# Patient Record
Sex: Male | Born: 1970 | Race: White | Hispanic: No | Marital: Married | Smoking: Never smoker
Health system: Southern US, Community
[De-identification: ages and names within clinical notes are randomized; demographics above are authoritative.]

## PROBLEM LIST (undated history)

## (undated) DIAGNOSIS — K5792 Diverticulitis of intestine, part unspecified, without perforation or abscess without bleeding: Secondary | ICD-10-CM

## (undated) DIAGNOSIS — K219 Gastro-esophageal reflux disease without esophagitis: Secondary | ICD-10-CM

## (undated) DIAGNOSIS — E079 Disorder of thyroid, unspecified: Secondary | ICD-10-CM

## (undated) DIAGNOSIS — E039 Hypothyroidism, unspecified: Secondary | ICD-10-CM

## (undated) HISTORY — PX: WISDOM TOOTH EXTRACTION: SHX21

## (undated) HISTORY — PX: OTHER SURGICAL HISTORY: SHX169

---

## 2008-02-05 ENCOUNTER — Emergency Department (HOSPITAL_COMMUNITY): Admission: EM | Admit: 2008-02-05 | Discharge: 2008-02-05 | Payer: Self-pay | Admitting: Emergency Medicine

## 2008-12-04 ENCOUNTER — Encounter: Admission: RE | Admit: 2008-12-04 | Discharge: 2008-12-04 | Payer: Self-pay | Admitting: Family Medicine

## 2011-08-01 LAB — CBC
HCT: 43
Hemoglobin: 15.2
MCV: 86.2
Platelets: 290
RBC: 4.98
WBC: 7.9

## 2011-08-01 LAB — URINALYSIS, ROUTINE W REFLEX MICROSCOPIC
Glucose, UA: NEGATIVE
Nitrite: NEGATIVE
Specific Gravity, Urine: 1.012
pH: 6.5

## 2011-08-01 LAB — DIFFERENTIAL
Eosinophils Absolute: 0.1
Eosinophils Relative: 1
Lymphocytes Relative: 28
Lymphs Abs: 2.2
Monocytes Relative: 7

## 2011-08-01 LAB — BASIC METABOLIC PANEL
BUN: 11
Chloride: 105
GFR calc Af Amer: >60
GFR calc non Af Amer: >60
Potassium: 4.4
Sodium: 138

## 2014-08-13 ENCOUNTER — Emergency Department (HOSPITAL_BASED_OUTPATIENT_CLINIC_OR_DEPARTMENT_OTHER)
Admission: EM | Admit: 2014-08-13 | Discharge: 2014-08-14 | Disposition: A | Discharge status: Home / Self Care | Payer: 59 | Attending: Emergency Medicine | Admitting: Emergency Medicine

## 2014-08-13 ENCOUNTER — Encounter (HOSPITAL_BASED_OUTPATIENT_CLINIC_OR_DEPARTMENT_OTHER): Payer: Self-pay | Admitting: Emergency Medicine

## 2014-08-13 DIAGNOSIS — J029 Acute pharyngitis, unspecified: Secondary | ICD-10-CM | POA: Diagnosis present

## 2014-08-13 DIAGNOSIS — R110 Nausea: Secondary | ICD-10-CM | POA: Insufficient documentation

## 2014-08-13 DIAGNOSIS — H9209 Otalgia, unspecified ear: Secondary | ICD-10-CM | POA: Insufficient documentation

## 2014-08-13 DIAGNOSIS — B349 Viral infection, unspecified: Secondary | ICD-10-CM

## 2014-08-13 DIAGNOSIS — E079 Disorder of thyroid, unspecified: Secondary | ICD-10-CM | POA: Insufficient documentation

## 2014-08-13 HISTORY — DX: Disorder of thyroid, unspecified: E07.9

## 2014-08-13 LAB — RAPID STREP SCREEN (MED CTR MEBANE ONLY): Streptococcus, Group A Screen (Direct): NEGATIVE

## 2014-08-13 MED ORDER — NAPROXEN 250 MG PO TABS
375.0000 mg | ORAL_TABLET | Freq: Once | ORAL | Status: DC
  Filled 2014-08-13: qty 2

## 2014-08-13 MED ORDER — FLUTICASONE PROPIONATE 50 MCG/ACT NA SUSP: 2.0000 | Freq: Every day | NASAL | Status: AC

## 2014-08-13 MED ORDER — NAPROXEN 375 MG PO TABS: 375.0000 mg | ORAL_TABLET | Freq: Two times a day (BID) | ORAL | Status: DC

## 2014-08-13 MED ORDER — CETIRIZINE HCL 10 MG PO CAPS: 1.0000 | ORAL_CAPSULE | ORAL | Status: DC

## 2014-08-13 NOTE — ED Provider Notes (Signed)
CSN: [id number]     Arrival date & time 08/13/14  2111 History  This chart was scribed for Minie Roadcap Smitty Cords, MD by Jarvis Morgan, ED Scribe. This patient was seen in room MH06/MH06 and the patient's care was started at 11:45 PM.    Chief Complaint  Patient presents with  . Sore Throat      Patient is a 43 y.o. male presenting with pharyngitis. The history is provided by the patient. No language interpreter was used.  Sore Throat This is a new problem. The current episode started 3 to 5 hours ago. The problem occurs constantly. The problem has not changed since onset.Pertinent negatives include no chest pain, no abdominal pain, no headaches and no shortness of breath. Nothing aggravates the symptoms. Nothing relieves the symptoms. He has tried nothing for the symptoms. The treatment provided no relief.   HPI Comments: Dylan Mccall is a 43 y.o. male who presents to the Emergency Department complaining of a strep throat that began earlier today. He is having associated pressure in his ears, nausea, watery eyes. He has not taken anything for the symptoms. Pt is worried he has step. He has a h/o of frequent strep infections. He denies any vomiting, fever, chills, diarrhea, or abdominal pain.   Past Medical History  Diagnosis Date  . Thyroid disease    History reviewed. No pertinent past surgical history. No family history on file. History  Substance Use Topics  . Smoking status: Never Smoker   . Smokeless tobacco: Not on file  . Alcohol Use: No    Review of Systems  Constitutional: Negative for fever and chills.  HENT: Positive for congestion, ear pain and sore throat. Negative for trouble swallowing and voice change.   Respiratory: Negative for shortness of breath.   Cardiovascular: Negative for chest pain.  Gastrointestinal: Positive for nausea. Negative for abdominal pain and diarrhea.  Neurological: Negative for headaches.  All other systems reviewed and are  negative.     Allergies  Review of patient's allergies indicates no known allergies.  Home Medications   Prior to Admission medications   Medication Sig Start Date End Date Taking? Authorizing Provider  Levothyroxine Sodium (SYNTHROID PO) Take by mouth.   Yes Historical Provider, MD   BP 131/99  Pulse 74  Temp(Src) 98.2 F (36.8 C) (Oral)  Resp 20  Ht 5\' 10"  (1.778 m)  Wt 218 lb (98.884 kg)  BMI 31.28 kg/m2  SpO2 98% Physical Exam  Nursing note and vitals reviewed. Constitutional: He is oriented to person, place, and time. He appears well-developed and well-nourished. No distress.  HENT:  Head: Normocephalic and atraumatic.  Right Ear: Tympanic membrane normal.  Left Ear: Tympanic membrane normal.  Mouth/Throat: Uvula is midline and oropharynx is clear and moist. No oral lesions. No oropharyngeal exudate, posterior oropharyngeal edema or posterior oropharyngeal erythema.  Eyes: Conjunctivae and EOM are normal. Pupils are equal, round, and reactive to light.  Neck: Trachea normal and normal range of motion. Neck supple. No tracheal deviation present.  Easily manipulated trachea, no pain with displacement of the trachea  Cardiovascular: Normal rate.   Pulmonary/Chest: Effort normal. No respiratory distress. He has no wheezes. He has no rales.  Abdominal: Soft. Bowel sounds are normal. There is no tenderness. There is no rebound and no guarding.  Musculoskeletal: Normal range of motion.  Lymphadenopathy:  No lymph nodes  Neurological: He is alert and oriented to person, place, and time.  Skin: Skin is warm and dry.  Psychiatric:  He has a normal mood and affect. His behavior is normal.    ED Course  Procedures (including critical care time) Labs Review Labs Reviewed  RAPID STREP SCREEN  CULTURE, GROUP A STREP    Imaging Review No results found.   EKG Interpretation None      MDM   Final diagnoses:  None   Based on centor criteria no need for further  intervention Viral syndrome.  Will treat with NSAIDS, flonase and zyrtec.  Follow up with your family doctor    Davie Sagona K Fifi Schindler-Rasch, MD 08/13/14 2357

## 2014-08-13 NOTE — ED Notes (Signed)
Sore throat, pressure in his ears and nausea. He feels like he has strep.

## 2014-08-13 NOTE — ED Notes (Signed)
sorethroat onset 1400 this pm,  Eyes and ears hurtingg, denies cough or congestion

## 2014-08-14 MED ORDER — NAPROXEN 250 MG PO TABS
500.0000 mg | ORAL_TABLET | Freq: Once | ORAL | Status: AC
Start: 2014-08-14 — End: 2014-08-14
  Administered 2014-08-14: 500 mg via ORAL

## 2014-08-15 LAB — CULTURE, GROUP A STREP

## 2015-02-03 ENCOUNTER — Other Ambulatory Visit: Payer: Self-pay | Admitting: Family Medicine

## 2015-02-04 ENCOUNTER — Other Ambulatory Visit: Payer: Self-pay | Admitting: Family Medicine

## 2015-02-04 DIAGNOSIS — M7989 Other specified soft tissue disorders: Secondary | ICD-10-CM

## 2015-02-08 ENCOUNTER — Ambulatory Visit
Admission: RE | Admit: 2015-02-08 | Discharge: 2015-02-08 | Disposition: A | Discharge status: Home / Self Care | Payer: 59 | Source: Ambulatory Visit | Attending: Family Medicine | Admitting: Family Medicine

## 2015-02-08 DIAGNOSIS — M7989 Other specified soft tissue disorders: Secondary | ICD-10-CM

## 2015-02-13 ENCOUNTER — Encounter (HOSPITAL_COMMUNITY): Payer: Self-pay | Admitting: Emergency Medicine

## 2015-02-13 ENCOUNTER — Emergency Department (HOSPITAL_COMMUNITY)
Admission: EM | Admit: 2015-02-13 | Discharge: 2015-02-13 | Disposition: A | Discharge status: Home / Self Care | Payer: 59 | Attending: Emergency Medicine | Admitting: Emergency Medicine

## 2015-02-13 DIAGNOSIS — L550 Sunburn of first degree: Secondary | ICD-10-CM | POA: Diagnosis not present

## 2015-02-13 DIAGNOSIS — E079 Disorder of thyroid, unspecified: Secondary | ICD-10-CM | POA: Diagnosis not present

## 2015-02-13 DIAGNOSIS — R110 Nausea: Secondary | ICD-10-CM | POA: Insufficient documentation

## 2015-02-13 DIAGNOSIS — M79601 Pain in right arm: Secondary | ICD-10-CM | POA: Insufficient documentation

## 2015-02-13 DIAGNOSIS — R51 Headache: Secondary | ICD-10-CM | POA: Insufficient documentation

## 2015-02-13 DIAGNOSIS — Z7951 Long term (current) use of inhaled steroids: Secondary | ICD-10-CM | POA: Insufficient documentation

## 2015-02-13 DIAGNOSIS — R42 Dizziness and giddiness: Secondary | ICD-10-CM | POA: Insufficient documentation

## 2015-02-13 DIAGNOSIS — M542 Cervicalgia: Secondary | ICD-10-CM

## 2015-02-13 DIAGNOSIS — R0789 Other chest pain: Secondary | ICD-10-CM | POA: Diagnosis not present

## 2015-02-13 DIAGNOSIS — Z79899 Other long term (current) drug therapy: Secondary | ICD-10-CM | POA: Diagnosis not present

## 2015-02-13 DIAGNOSIS — R5383 Other fatigue: Secondary | ICD-10-CM | POA: Insufficient documentation

## 2015-02-13 LAB — CBC
HCT: 44.5 % (ref 39.0–52.0)
Hemoglobin: 14.9 g/dL (ref 13.0–17.0)
MCH: 29.7 pg (ref 26.0–34.0)
MCHC: 33.5 g/dL (ref 30.0–36.0)
MCV: 88.8 fL (ref 78.0–100.0)
Platelets: 300 10*3/uL (ref 150–400)
RBC: 5.01 MIL/uL (ref 4.22–5.81)
RDW: 13.4 % (ref 11.5–15.5)
WBC: 10.2 10*3/uL (ref 4.0–10.5)

## 2015-02-13 LAB — HEPATIC FUNCTION PANEL
ALBUMIN: 4.8 g/dL (ref 3.5–5.2)
ALK PHOS: 78 U/L (ref 39–117)
ALT: 34 U/L (ref 0–53)
AST: 31 U/L (ref 0–37)
BILIRUBIN DIRECT: 0.2 mg/dL (ref 0.0–0.5)
BILIRUBIN INDIRECT: 0.8 mg/dL (ref 0.3–0.9)
BILIRUBIN TOTAL: 1 mg/dL (ref 0.3–1.2)
TOTAL PROTEIN: 8.2 g/dL (ref 6.0–8.3)

## 2015-02-13 LAB — D-DIMER, QUANTITATIVE: D-Dimer, Quant: <0.27 ug{FEU}/mL (ref 0.00–0.48)

## 2015-02-13 LAB — I-STAT TROPONIN, ED
TROPONIN I, POC: 0 ng/mL (ref 0.00–0.08)
Troponin i, poc: 0 ng/mL (ref 0.00–0.08)

## 2015-02-13 LAB — BASIC METABOLIC PANEL
Anion gap: 10 (ref 5–15)
BUN: 20 mg/dL (ref 6–23)
CHLORIDE: 105 mmol/L (ref 96–112)
CO2: 24 mmol/L (ref 19–32)
CREATININE: 0.95 mg/dL (ref 0.50–1.35)
Calcium: 8.9 mg/dL (ref 8.4–10.5)
GFR calc Af Amer: >90 mL/min (ref >90)
GLUCOSE: 88 mg/dL (ref 70–99)
POTASSIUM: 4.1 mmol/L (ref 3.5–5.1)
Sodium: 139 mmol/L (ref 135–145)

## 2015-02-13 LAB — LIPASE, BLOOD: Lipase: 28 U/L (ref 11–59)

## 2015-02-13 MED ORDER — ONDANSETRON HCL 4 MG/2ML IJ SOLN
4.0000 mg | Freq: Once | INTRAMUSCULAR | Status: AC
  Administered 2015-02-13: 4 mg via INTRAVENOUS
  Filled 2015-02-13: qty 2

## 2015-02-13 MED ORDER — SODIUM CHLORIDE 0.9 % IV BOLUS (SEPSIS)
1000.0000 mL | Freq: Once | INTRAVENOUS | Status: AC
  Administered 2015-02-13: 1000 mL via INTRAVENOUS

## 2015-02-13 NOTE — ED Notes (Signed)
Delay on lab draw nurse will start IV.

## 2015-02-13 NOTE — ED Notes (Signed)
Pt c/o intermittent dizziness, nausea, weakness; along with right sided arm pain, right sided facial numbness, burning, and heat, dizziness, nausea. Pt ambulating when he suddenly felt weak and needed to sit down.

## 2015-02-13 NOTE — ED Provider Notes (Signed)
CSN: [id number]     Arrival date & time 02/13/15  1647 History   First MD Initiated Contact with Patient 02/13/15 1732     Chief Complaint  Patient presents with  . Dizziness  . Nausea  . Arm Pain     (Consider location/radiation/quality/duration/timing/severity/associated sxs/prior Treatment) HPI Comments: Patient presents to emergency department with multiple complaints. States that today while working in the yard he had sudden onset dizziness, nausea, and fatigue. Reports associated right-sided neck and arm pain. Reports associated right-sided facial numbness without weakness. States that the right side of his head and face feel warm.  States that he was playing tennis this morning and then went home and was spreading 60 bags of mulch. Denies any chest pain, shortness of breath, or abdominal pain. Denies any aggravating or alleviating factors. He has never experienced this before.  The history is provided by the patient. No language interpreter was used.    Past Medical History  Diagnosis Date  . Thyroid disease    History reviewed. No pertinent past surgical history. History reviewed. No pertinent family history. History  Substance Use Topics  . Smoking status: Never Smoker   . Smokeless tobacco: Not on file  . Alcohol Use: No    Review of Systems  Constitutional: Positive for fatigue. Negative for fever and chills.  Respiratory: Negative for shortness of breath.   Cardiovascular: Negative for chest pain.  Gastrointestinal: Positive for nausea. Negative for vomiting, diarrhea and constipation.  Genitourinary: Negative for dysuria.  Musculoskeletal: Positive for neck pain.  Neurological: Positive for dizziness.  All other systems reviewed and are negative.     Allergies  Erythromycin  Home Medications   Prior to Admission medications   Medication Sig Start Date End Date Taking? Authorizing Provider  Cetirizine HCl (ZYRTEC ALLERGY) 10 MG CAPS Take 1 capsule (10 mg  total) by mouth 1 day or 1 dose. 08/13/14  Yes April Palumbo, MD  fluticasone [facility](FLONASE) 50 MCG/ACT nasal spray Place 2 sprays into both nostrils daily. 08/13/14  Yes April Palumbo, MD  ibuprofen (ADVIL,MOTRIN) 200 MG tablet Take 400 mg by mouth every 6 (six) hours as needed for moderate pain.   Yes Historical Provider, MD  Levothyroxine Sodium (SYNTHROID PO) Take 137 mcg by mouth daily.    Yes Historical Provider, MD   BP 138/98 mmHg  Pulse 98  Temp(Src) 98.2 F (36.8 C) (Oral)  Resp 16  SpO2 94% Physical Exam  Constitutional: He is oriented to person, place, and time. He appears well-developed and well-nourished.  HENT:  Head: Normocephalic and atraumatic.  Eyes: Conjunctivae and EOM are normal. Pupils are equal, round, and reactive to light. Right eye exhibits no discharge. Left eye exhibits no discharge. No scleral icterus.  Neck: Normal range of motion. Neck supple. No JVD present.  Cardiovascular: Normal rate, regular rhythm and normal heart sounds.  Exam reveals no gallop and no friction rub.   No murmur heard. Pulmonary/Chest: Effort normal and breath sounds normal. No respiratory distress. He has no wheezes. He has no rales. He exhibits no tenderness.  CTAB  Abdominal: Soft. He exhibits no distension and no mass. There is no tenderness. There is no rebound and no guarding.  Musculoskeletal: Normal range of motion. He exhibits no edema or tenderness.  Moves all extremities, ROM and strength 5/5  Neurological: He is alert and oriented to person, place, and time.  CN 3-12 intact, speech is clear, movements are goal oriented  Skin: Skin is warm and dry.  Erythematous skin on right side of face, neck, and head, appearance equal to that of sunburn  Psychiatric: He has a normal mood and affect. His behavior is normal. Judgment and thought content normal.  Nursing note and vitals reviewed.   ED Course  Procedures (including critical care time) Results for orders placed or performed  during the hospital encounter of 02/13/15  CBC  Result Value Ref Range   WBC 10.2 4.0 - 10.5 K/uL   RBC 5.01 4.22 - 5.81 MIL/uL   Hemoglobin 14.9 13.0 - 17.0 g/dL   HCT 16.1 09.6 - 04.5 %   MCV 88.8 78.0 - 100.0 fL   MCH 29.7 26.0 - 34.0 pg   MCHC 33.5 30.0 - 36.0 g/dL   RDW 40.9 81.1 - 91.4 %   Platelets 300 150 - 400 K/uL  Basic metabolic panel  Result Value Ref Range   Sodium 139 135 - 145 mmol/L   Potassium 4.1 3.5 - 5.1 mmol/L   Chloride 105 96 - 112 mmol/L   CO2 24 19 - 32 mmol/L   Glucose, Bld 88 70 - 99 mg/dL   BUN 20 6 - 23 mg/dL   Creatinine, Ser 7.82 0.50 - 1.35 mg/dL   Calcium 8.9 8.4 - 95.6 mg/dL   GFR calc non Af Amer >90 >90 mL/min   GFR calc Af Amer >90 >90 mL/min   Anion gap 10 5 - 15  D-dimer, quantitative  Result Value Ref Range   D-Dimer, Quant <0.27 0.00 - 0.48 ug/mL-FEU  Lipase, blood  Result Value Ref Range   Lipase 28 11 - 59 U/L  Hepatic function panel  Result Value Ref Range   Total Protein 8.2 6.0 - 8.3 g/dL   Albumin 4.8 3.5 - 5.2 g/dL   AST 31 0 - 37 U/L   ALT 34 0 - 53 U/L   Alkaline Phosphatase 78 39 - 117 U/L   Total Bilirubin 1.0 0.3 - 1.2 mg/dL   Bilirubin, Direct 0.2 0.0 - 0.5 mg/dL   Indirect Bilirubin 0.8 0.3 - 0.9 mg/dL  I-stat troponin, ED (not at [facility])  Result Value Ref Range   Troponin i, poc 0.00 0.00 - 0.08 ng/mL   Comment 3           [place] Chest  02/08/2015   CLINICAL DATA:  44 year old male with palpable soft tissue abnormality at the left lateral thoracic region posteriorly, reportedly discovered in October 2015 but suspected to be lipoma based on physical exam. Initial encounter.  EXAM: CHEST ULTRASOUND  COMPARISON:  CT Abdomen and Pelvis 12/04/2008.  FINDINGS: Ultrasound scanning in the area of clinical concern. An Ing capsulated oval mass is identified measuring 9.4 x 2.2 x 8.2 cm. This has internal echotexture very similar to subcutaneous fat (image 3) with an echogenic fairly uniform appearing capsule which measures 1-2 mm.  No hypervascularity evident on Doppler imaging. Surrounding fibromuscular architecture appears within normal limits.  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  If surgical resection is not desired then clinical follow-up is recommended, with repeat imaging by either CT or MRI (with IV contrast) if the lesion rapidly enlarges or becomes painful.   Electronically Signed   By: Odessa Fleming M.D.   On: 02/08/2015 16:20      EKG Interpretation   Date/Time:  Saturday February 13 2015 17:00:41 EDT Ventricular Rate:  94 PR Interval:  151 QRS Duration: 88 QT Interval:  330 QTC Calculation: 413 R Axis:   71 Text Interpretation:  Sinus rhythm No significant change since last  tracing Confirmed by STEINL  MD, Caryn Bee ([id number]) on 02/13/2015 5:33:39 PM      MDM   Final diagnoses:  Dizziness  Nausea  Pain of right upper extremity  Neck pain    Patient with dizziness, right arm pain, some face pain and spinal chest pain. Was playing tennis this morning, and then unloaded about 60 bags of mulch. Denied breakfast. States that he has had some generalized fatigue, feeling like he is going to pass out.    Will check labs and reassess.  Labs are reassuring.  Patient seen by and discussed with Dr. Denton Lank.  Recommends d-dimer, LFTs and lipase.  8:38 PM Dr. Denton Lank recommends delta troponin and discharge to home if negative.  Will recommend PCP and cardiology follow-up.  Patient may benefit from a stress test.  This was discussed with the patient, who understands and agrees with the plan.  Delta trop negative.   Roxy Horseman, PA-C 02/13/15 1191  Cathren Laine, MD 02/13/15 [phone]

## 2015-02-13 NOTE — Discharge Instructions (Signed)
°Chest Pain (Nonspecific) °It is often hard to give a specific diagnosis for the cause of chest pain. There is always a chance that your pain could be related to something serious, such as a heart attack or a blood clot in the lungs. You need to follow up with your health care provider for further evaluation. °CAUSES  °· Heartburn. °· Pneumonia or bronchitis. °· Anxiety or stress. °· Inflammation around your heart (pericarditis) or lung (pleuritis or pleurisy). °· A blood clot in the lung. °· A collapsed lung (pneumothorax). It can develop suddenly on its own (spontaneous pneumothorax) or from trauma to the chest. °· Shingles infection (herpes zoster virus). °The chest wall is composed of bones, muscles, and cartilage. Any of these can be the source of the pain. °· The bones can be bruised by injury. °· The muscles or cartilage can be strained by coughing or overwork. °· The cartilage can be affected by inflammation and become sore (costochondritis). °DIAGNOSIS  °Lab tests or other studies may be needed to find the cause of your pain. Your health care provider may have you take a test called an ambulatory electrocardiogram (ECG). An ECG records your heartbeat patterns over a 24-hour period. You may also have other tests, such as: °· Transthoracic echocardiogram (TTE). During echocardiography, sound waves are used to evaluate how blood flows through your heart. °· Transesophageal echocardiogram (TEE). °· Cardiac monitoring. This allows your health care provider to monitor your heart rate and rhythm in real time. °· Holter monitor. This is a portable device that records your heartbeat and can help diagnose heart arrhythmias. It allows your health care provider to track your heart activity for several days, if needed. °· Stress tests by exercise or by giving medicine that makes the heart beat faster. °TREATMENT  °· Treatment depends on what may be causing your chest pain. Treatment may include: °· Acid blockers for  heartburn. °· Anti-inflammatory medicine. °· Pain medicine for inflammatory conditions. °· Antibiotics if an infection is present. °· You may be advised to change lifestyle habits. This includes stopping smoking and avoiding alcohol, caffeine, and chocolate. °· You may be advised to keep your head raised (elevated) when sleeping. This reduces the chance of acid going backward from your stomach into your esophagus. °Most of the time, nonspecific chest pain will improve within 2-3 days with rest and mild pain medicine.  °HOME CARE INSTRUCTIONS  °· If antibiotics were prescribed, take them as directed. Finish them even if you start to feel better. °· For the next few days, avoid physical activities that bring on chest pain. Continue physical activities as directed. °· Do not use any tobacco products, including cigarettes, chewing tobacco, or electronic cigarettes. °· Avoid drinking alcohol. °· Only take medicine as directed by your health care provider. °· Follow your health care provider's suggestions for further testing if your chest pain does not go away. °· Keep any follow-up appointments you made. If you do not go to an appointment, you could develop lasting (chronic) problems with pain. If there is any problem keeping an appointment, call to reschedule. °SEEK MEDICAL CARE IF:  °· Your chest pain does not go away, even after treatment. °· You have a rash with blisters on your chest. °· You have a fever. °SEEK IMMEDIATE MEDICAL CARE IF:  °· You have increased chest pain or pain that spreads to your arm, neck, jaw, back, or abdomen. °· You have shortness of breath. °· You have an increasing cough, or you cough   up blood. °· You have severe back or abdominal pain. °· You feel nauseous or vomit. °· You have severe weakness. °· You faint. °· You have chills. °This is an emergency. Do not wait to see if the pain will go away. Get medical help at once. Call your local emergency services (911 in [place] Do not drive  yourself to the hospital. °MAKE SURE YOU:  °· Understand these instructions. °· Will watch your condition. °· Will get help right away if you are not doing well or get worse. °Document Released: 08/02/2005 Document Revised: 10/28/2013 Document Reviewed: 05/28/2008 °Exit[employer]® Patient Information ©2015 [employer]. This information is not intended to replace advice given to you by your health care provider. Make sure you discuss any questions you have with your health care provider. ° ° °Dizziness °Dizziness is a common problem. It is a feeling of unsteadiness or light-headedness. You may feel like you are about to faint. Dizziness can lead to injury if you stumble or fall. A person of any age group can suffer from dizziness, but dizziness is more common in older adults. °CAUSES  °Dizziness can be caused by many different things, including: °· Middle ear problems. °· Standing for too long. °· Infections. °· An allergic reaction. °· Aging. °· An emotional response to something, such as the sight of blood. °· Side effects of medicines. °· Tiredness. °· Problems with circulation or blood pressure. °· Excessive use of alcohol or medicines, or illegal drug use. °· Breathing too fast (hyperventilation). °· An irregular heart rhythm (arrhythmia). °· A low red blood cell count (anemia). °· Pregnancy. °· Vomiting, diarrhea, fever, or other illnesses that cause body fluid loss (dehydration). °· Diseases or conditions such as Parkinson's disease, high blood pressure (hypertension), diabetes, and thyroid problems. °· Exposure to extreme heat. °DIAGNOSIS  °Your health care provider will ask about your symptoms, perform a physical exam, and perform an electrocardiogram (ECG) to record the electrical activity of your heart. Your health care provider may also perform other heart or blood tests to determine the cause of your dizziness. These may include: °· Transthoracic echocardiogram (TTE). During echocardiography, sound waves are  used to evaluate how blood flows through your heart. °· Transesophageal echocardiogram (TEE). °· Cardiac monitoring. This allows your health care provider to monitor your heart rate and rhythm in real time. °· Holter monitor. This is a portable device that records your heartbeat and can help diagnose heart arrhythmias. It allows your health care provider to track your heart activity for several days if needed. °· Stress tests by exercise or by giving medicine that makes the heart beat faster. °TREATMENT  °Treatment of dizziness depends on the cause of your symptoms and can vary greatly. °HOME CARE INSTRUCTIONS  °· Drink enough fluids to keep your urine clear or pale yellow. This is especially important in very hot weather. In older adults, it is also important in cold weather. °· Take your medicine exactly as directed if your dizziness is caused by medicines. When taking blood pressure medicines, it is especially important to get up slowly. °¨ Rise slowly from chairs and steady yourself until you feel okay. °¨ In the morning, first sit up on the side of the bed. When you feel okay, stand slowly while holding onto something until you know your balance is fine. °· Move your legs often if you need to stand in one place for a long time. Tighten and relax your muscles in your legs while standing. °· Have someone stay   with you for 1-2 days if dizziness continues to be a problem. Do this until you feel you are well enough to stay alone. Have the person call your health care provider if he or she notices changes in you that are concerning. °· Do not drive or use heavy machinery if you feel dizzy. °· Do not drink alcohol. °SEEK IMMEDIATE MEDICAL CARE IF:  °· Your dizziness or light-headedness gets worse. °· You feel nauseous or vomit. °· You have problems talking, walking, or using your arms, hands, or legs. °· You feel weak. °· You are not thinking clearly or you have trouble forming sentences. It may take a friend or  family member to notice this. °· You have chest pain, abdominal pain, shortness of breath, or sweating. °· Your vision changes. °· You notice any bleeding. °· You have side effects from medicine that seems to be getting worse rather than better. °MAKE SURE YOU:  °· Understand these instructions. °· Will watch your condition. °· Will get help right away if you are not doing well or get worse. °Document Released: 04/18/2001 Document Revised: 10/28/2013 Document Reviewed: 05/12/2011 °ExitCare® Patient Information ©2015 [employer]. This information is not intended to replace advice given to you by your health care provider. Make sure you discuss any questions you have with your health care provider. ° ° °

## 2015-03-25 NOTE — Progress Notes (Signed)
Patient ID: Dylan Hencedward Ezra, male   DOB: 13-Aug-1971, 44 y.o.   MRN: [id number]     Cardiology Office Note   Date:  03/29/2015   ID:  Dylan Hencedward Lau, DOB 13-Aug-1971, MRN [id number]  PCP:  Jarrett SohoWharton, Courtney, PA-C  Cardiologist:   Charlton HawsPeter Tylee Newby, MD   Chief Complaint  Patient presents with  . Appointment    EPH      History of Present Illness: Dylan Mccall is a 44 y.o. male who presents for evaluation after seen in ER 02/13/15.  Patient presented to emergency department with multiple complaints.Indicated that right side of his head and face feel warm. States that he was playing tennis in morning and then went home and was spreading 60 bags of mulch. Denies any chest pain, shortness of breath, or abdominal pain. Denies any aggravating or alleviating factors. He has never experienced this before ER w/u negative  Troponin normal D/C with no further w/u  Doing well since d/c with no symptoms  No chest pain   Has 3 kids works from home active     Past Medical History  Diagnosis Date  . Thyroid disease     History reviewed. No pertinent past surgical history.   Current Outpatient Prescriptions  Medication Sig Dispense Refill  . Cetirizine HCl 10 MG CAPS Take 1 capsule by mouth daily as needed (for allergies).    . fluticasone (FLONASE) 50 MCG/ACT nasal spray Place 2 sprays into both nostrils daily. 16 g 0  . ibuprofen (ADVIL,MOTRIN) 200 MG tablet Take 400 mg by mouth every 6 (six) hours as needed for moderate pain.    . Levothyroxine Sodium (SYNTHROID PO) Take 137 mcg by mouth daily.      No current facility-administered medications for this visit.    Allergies:   Erythromycin    Social History:  The patient  reports that he has never smoked. He does not have any smokeless tobacco history on file. He reports that he does not drink alcohol or use illicit drugs.   Family History:  The patient's family history includes Diabetes type II in his mother; Heart disease in his father; Rheum  arthritis in his sister.    ROS:  Please see the history of present illness.   Otherwise, review of systems are positive for none.   All other systems are reviewed and negative.    PHYSICAL EXAM: VS:  BP 94/60 mmHg  Pulse 63  Ht 5' 10.5" (1.791 m)  Wt 105.688 kg (233 lb)  BMI 32.95 kg/m2 , BMI Body mass index is 32.95 kg/(m^2). Affect appropriate Healthy:  appears stated age HEENT: normal Neck supple with no adenopathy JVP normal no bruits no thyromegaly Lungs clear with no wheezing and good diaphragmatic motion Heart:  S1/S2 no murmur, no rub, gallop or click PMI normal Abdomen: benighn, BS positve, no tenderness, no AAA no bruit.  No HSM or HJR Distal pulses intact with no bruits No edema Neuro non-focal Skin warm and dry No muscular weakness    EKG:   02/14/15  SR rate 98 normal ECG    Recent Labs: 02/13/2015: ALT 34; BUN 20; Creatinine 0.95; Hemoglobin 14.9; Platelets 300; Potassium 4.1; Sodium 139    Lipid Panel No results found for: CHOL, TRIG, HDL, CHOLHDL, VLDL, LDLCALC, LDLDIRECT    Wt Readings from Last 3 Encounters:  03/29/15 105.688 kg (233 lb)  08/13/14 98.884 kg (218 lb)      Other studies Reviewed: Additional studies/ records that were reviewed today include:  Epic ER notes / labs and ECG.    ASSESSMENT AND PLAN:  1.  Pre -syncope:  Non cardiac dehydration resolved no need for further w/u  F/U [facility].  Discussed coronary calcium score and vascular screening  Studies if he is interested in future    Current medicines are reviewed at length with the patient today.  The patient does not have concerns regarding medicines.  The following changes have been made:  no change  Labs/ tests ordered today include: None  No orders of the defined types were placed in this encounter.     Disposition:   FU with me PRN     Signed, Charlton HawsPeter Cortni Tays, MD  03/29/2015 8:25 AM    [facility] [address], [place], [place]   [address] Phone: [phone](336) [phone]; Fax: [phone](336) [phone]

## 2015-03-29 ENCOUNTER — Ambulatory Visit (INDEPENDENT_AMBULATORY_CARE_PROVIDER_SITE_OTHER): Payer: 59 | Admitting: Cardiovascular Disease

## 2015-03-29 ENCOUNTER — Encounter: Payer: Self-pay | Admitting: Cardiovascular Disease

## 2015-03-29 VITALS — BP 94/60 | HR 63 | Ht 70.5 in | Wt 233.0 lb

## 2015-03-29 DIAGNOSIS — E860 Dehydration: Secondary | ICD-10-CM | POA: Diagnosis not present

## 2015-03-29 NOTE — Patient Instructions (Signed)
Your physician recommends that you schedule a follow-up appointment in:  AS NEEDED MAY  CALL IF  DECIDES  TO DO CA  SCORE  AND  VASCULAR  SCREEENING

## 2018-07-15 ENCOUNTER — Ambulatory Visit: Payer: Managed Care, Other (non HMO) | Admitting: Podiatry

## 2018-07-15 ENCOUNTER — Encounter: Payer: Self-pay | Admitting: Podiatry

## 2018-07-15 VITALS — BP 122/82 | HR 63

## 2018-07-15 DIAGNOSIS — M76821 Posterior tibial tendinitis, right leg: Secondary | ICD-10-CM | POA: Diagnosis not present

## 2018-07-15 DIAGNOSIS — M898X7 Other specified disorders of bone, ankle and foot: Secondary | ICD-10-CM

## 2018-07-15 NOTE — Progress Notes (Signed)
   HPI: 47 year old male otherwise healthy presents the office today for evaluation regarding right foot pain.  The patient has been treated conservatively for several years now by Dr. Elijah Birk.  Patient was eventually referred here for surgical consultation.  Patient states that he has a bony prominence to the medial aspect of the right foot.  He is received injections in multiple conservative treatment modalities that have not alleviated any of his symptoms including orthotics, anti-plantar injections, oral anti-inflammatories, and immobilization.  Patient is very healthy and active and would like a more aggressive approach to fix his symptoms.  Past Medical History:  Diagnosis Date  . Thyroid disease      Physical Exam: General: The patient is alert and oriented x3 in no acute distress.  Dermatology: Skin is warm, dry and supple bilateral lower extremities. Negative for open lesions or macerations.  Vascular: Palpable pedal pulses bilaterally. No edema or erythema noted. Capillary refill within normal limits.  Neurological: Epicritic and protective threshold grossly intact bilaterally.   Musculoskeletal Exam: Range of motion within normal limits to all pedal and ankle joints bilateral. Muscle strength 5/5 in all groups bilateral.  There is a large palpable prominence consistent with a hypertrophic navicular exostosis.  Pain on palpation along the insertion of the posterior tibial tendon.  Pes planus deformity also noted, flexible.  Radiographic Exam brought from Dr. Tasia Catchings office:  Normal osseous mineralization.  Irregularity noted to the talonavicular joint.  Hypertrophic navicular prominence also noted.  There is also beaking of the talonavicular joint medially.  Visualization of the talonavicular joint is limited on radiographic exam.  CT is warranted.  Assessment: 1.  Hypertrophic navicular exostosis right 2.  Posterior tibial tendinitis insertional right   Plan of Care:  1. Patient  evaluated. X-Rays reviewed that were brought from Dr. Tasia Catchings office.  2. Today we discussed the conservative versus surgical management of the presenting pathology. The patient opts for surgical management. All possible complications and details of the procedure were explained. All patient questions were answered. No guarantees were expressed or implied. 3. Authorization for surgery was initiated today. Surgery will consist of navicular exostectomy right.  Repair of posterior tibial tendon right. 4.  Prior to surgery and for medically necessary surgical planning I would like to order a CT scan right foot to rule out any coalition or unexpected exostosis. 5.  Return to clinic 1 week postop        Felecia Shelling, DPM [facility]  Dr. Felecia Shelling, DPM    [address] [place], [place] [address]                Office [phone]  Fax [phone]

## 2018-07-15 NOTE — Patient Instructions (Signed)
Pre-Operative Instructions  Congratulations, you have decided to take an important step towards improving your quality of life.  You can be assured that the doctors and staff at [facility] will be with you every step of the way.  Here are some important things you should know:  1. Plan to be at the surgery center/hospital at least 1 (one) hour prior to your scheduled time, unless otherwise directed by the surgical center/hospital staff.  You must have a responsible adult accompany you, remain during the surgery and drive you home.  Make sure you have directions to the surgical center/hospital to ensure you arrive on time. 2. If you are having surgery at [facility] or [facility], you will need a copy of your medical history and physical form from your family physician within one month prior to the date of surgery. We will give you a form for your primary physician to complete.  3. We make every effort to accommodate the date you request for surgery.  However, there are times where surgery dates or times have to be moved.  We will contact you as soon as possible if a change in schedule is required.   4. No aspirin/ibuprofen for one week before surgery.  If you are on aspirin, any non-steroidal anti-inflammatory medications (Mobic, Aleve, Ibuprofen) should not be taken seven (7) days prior to your surgery.  You make take Tylenol for pain prior to surgery.  5. Medications - If you are taking daily heart and blood pressure medications, seizure, reflux, allergy, asthma, anxiety, pain or diabetes medications, make sure you notify the surgery center/hospital before the day of surgery so they can tell you which medications you should take or avoid the day of surgery. 6. No food or drink after midnight the night before surgery unless directed otherwise by surgical center/hospital staff. 7. No alcoholic beverages 24-hours prior to surgery.  No smoking 24-hours prior or 24-hours after  surgery. 8. Wear loose pants or shorts. They should be loose enough to fit over bandages, boots, and casts. 9. Don't wear slip-on shoes. Sneakers are preferred. 10. Bring your boot with you to the surgery center/hospital.  Also bring crutches or a walker if your physician has prescribed it for you.  If you do not have this equipment, it will be provided for you after surgery. 11. If you have not been contacted by the surgery center/hospital by the day before your surgery, call to confirm the date and time of your surgery. 12. Leave-time from work may vary depending on the type of surgery you have.  Appropriate arrangements should be made prior to surgery with your employer. 13. Prescriptions will be provided immediately following surgery by your doctor.  Fill these as soon as possible after surgery and take the medication as directed. Pain medications will not be refilled on weekends and must be approved by the doctor. 14. Remove nail polish on the operative foot and avoid getting pedicures prior to surgery. 15. Wash the night before surgery.  The night before surgery wash the foot and leg well with water and the antibacterial soap provided. Be sure to pay special attention to beneath the toenails and in between the toes.  Wash for at least three (3) minutes. Rinse thoroughly with water and dry well with a towel.  Perform this wash unless told not to do so by your physician.  Enclosed: 1 Ice pack (please put in freezer the night before surgery)   1 Hibiclens skin cleaner     Pre-op instructions  If you have any questions regarding the instructions, please do not hesitate to call our office.  [place]: [address], [place], [place] [address] -- [phone]  [place]: [address] [place], [place] [address] -- [phone]  [place]: [address]  [place], [place] [address] -- [phone]  [place]: [address], [address], [place], [place] [address] -- [phone]  Website:  [email or web address] 

## 2018-07-16 ENCOUNTER — Telehealth: Payer: Self-pay | Admitting: *Deleted

## 2018-07-16 ENCOUNTER — Telehealth: Payer: Self-pay | Admitting: Podiatry

## 2018-07-16 DIAGNOSIS — M898X7 Other specified disorders of bone, ankle and foot: Secondary | ICD-10-CM

## 2018-07-16 DIAGNOSIS — M76821 Posterior tibial tendinitis, right leg: Secondary | ICD-10-CM

## 2018-07-16 NOTE — Telephone Encounter (Signed)
Pt called and was expecting a call to get his ct scan scheduled said Dr Logan Bores told him it would be a rush order.  I talked with JQ and the orders were faxed to [facility] and we would need a date of service to get the prior auth done.   I gave pt the contact number for [facility] and asked him to let us know when he was scheduled so we could get the prior auth done. He said thank you and will let us know when he is scheduled.

## 2018-07-16 NOTE — Telephone Encounter (Signed)
I was just speaking to someone and they told me to go ahead and call to get my CT scheduled. I just got it scheduled at [facility] this Thursday, 12 September at 1:20 pm. If you could please start the prior authorization. I'm planning on going to the appointment unless I get a phone call back. Thank you.

## 2018-07-16 NOTE — Telephone Encounter (Signed)
Pt states he has spoken to his gym staff, and they states a note from his doctor could freeze his gym membership until he is able to return.

## 2018-07-16 NOTE — Telephone Encounter (Signed)
Orders to J. Quintana, RN for pre-cert, faxed to [facility]. 

## 2018-07-16 NOTE — Telephone Encounter (Signed)
-----   Message from Felecia Shelling, DPM sent at 07/15/2018  6:38 PM EDT ----- Regarding: CT RT foot Please order CT scan right foot.  Medically necessary for surgical planning.   Dx: navicular exostosis right.  DJD talonavicular joint right.   Thanks, Dr. Logan Bores

## 2018-07-16 NOTE — Addendum Note (Signed)
Addended by: Alphia Kava D on: 07/16/2018 09:27 AM   Modules accepted: Orders

## 2018-07-17 ENCOUNTER — Encounter: Payer: Self-pay | Admitting: *Deleted

## 2018-07-17 ENCOUNTER — Telehealth: Payer: Self-pay | Admitting: *Deleted

## 2018-07-17 NOTE — Telephone Encounter (Signed)
"  Just calling to make sure you were of a patient that was added on late yesterday.  The patient is Dylan Mccall.  He's scheduled for a CT of the right foot without contrast.  He's on for tomorrow the 12th at 1:00 pm.  If you have any questions, give me a call."   Per Lahoma Crocker, I called and informed Victorino Dike that the authorization was pending.  His insurance requested clinical review.  She said she would let it process and will call in the morning to check on the status.

## 2018-07-17 NOTE — Telephone Encounter (Signed)
I called and informed the patient that he can come by to pick up his letter.

## 2018-07-17 NOTE — Telephone Encounter (Signed)
"  I have a question about my upcoming foot surgery on the twenty-sixth.  If you could give me a call, I'd appreciate it."

## 2018-07-18 ENCOUNTER — Encounter: Payer: Self-pay | Admitting: *Deleted

## 2018-07-18 ENCOUNTER — Ambulatory Visit
Admission: RE | Admit: 2018-07-18 | Discharge: 2018-07-18 | Disposition: A | Discharge status: Home / Self Care | Payer: 59 | Source: Ambulatory Visit | Attending: Podiatry | Admitting: Podiatry

## 2018-07-18 DIAGNOSIS — M898X7 Other specified disorders of bone, ankle and foot: Secondary | ICD-10-CM

## 2018-07-18 DIAGNOSIS — M76821 Posterior tibial tendinitis, right leg: Secondary | ICD-10-CM

## 2018-07-18 NOTE — Progress Notes (Signed)
Patient requested a prescription for a knee scooter.  He said he needs to submit it to his insurance.  They will deliver it to his home.

## 2018-07-23 NOTE — Telephone Encounter (Signed)
I returned his call.  He said he thinks he got the answers to his question from the letter that was prepared for his gym.  I told him to call if he thinks of any other questions.

## 2018-08-01 ENCOUNTER — Encounter: Payer: Self-pay | Admitting: Podiatry

## 2018-08-01 ENCOUNTER — Other Ambulatory Visit: Payer: Self-pay | Admitting: Podiatry

## 2018-08-01 DIAGNOSIS — M898X7 Other specified disorders of bone, ankle and foot: Secondary | ICD-10-CM

## 2018-08-01 DIAGNOSIS — M85471 Solitary bone cyst, right ankle and foot: Secondary | ICD-10-CM

## 2018-08-01 DIAGNOSIS — M76821 Posterior tibial tendinitis, right leg: Secondary | ICD-10-CM

## 2018-08-01 MED ORDER — OXYCODONE-ACETAMINOPHEN 5-325 MG PO TABS: 1.0000 | ORAL_TABLET | Freq: Four times a day (QID) | ORAL | 0 refills | Status: DC | PRN

## 2018-08-01 NOTE — Progress Notes (Signed)
Post op

## 2018-08-02 ENCOUNTER — Telehealth: Payer: Self-pay | Admitting: Podiatry

## 2018-08-02 NOTE — Telephone Encounter (Signed)
    Dylan Mccall [facility] patient presents to the office stating he is starting to have significant pain and discomfort at the surgical site. He had a navicular exostectomy performed and a reposition of the posterior tibial tendon of the right foot.  He says that his foot was anesthetized and the anesthesia and feeling has started to return.  He says he wants additional recommendations for pain control. He was told to remove his foot the Cam Walker and ice his leg above the level of the bandage.  I emphasized that he not ambulate without using the cam walker.  I also told him to take ibuprofen in addition to the oxycodone that he is taking for pain.  Patient was concerned that he was developing a DVT.   Patient was told to call with any concerns as needed.

## 2018-08-07 ENCOUNTER — Ambulatory Visit (INDEPENDENT_AMBULATORY_CARE_PROVIDER_SITE_OTHER): Payer: Managed Care, Other (non HMO)

## 2018-08-07 ENCOUNTER — Ambulatory Visit (INDEPENDENT_AMBULATORY_CARE_PROVIDER_SITE_OTHER): Payer: Self-pay | Admitting: Podiatry

## 2018-08-07 ENCOUNTER — Encounter: Payer: Self-pay | Admitting: Podiatry

## 2018-08-07 VITALS — BP 130/87 | HR 86 | Temp 98.2°F | Resp 16

## 2018-08-07 DIAGNOSIS — M76821 Posterior tibial tendinitis, right leg: Secondary | ICD-10-CM

## 2018-08-07 DIAGNOSIS — Z9889 Other specified postprocedural states: Secondary | ICD-10-CM

## 2018-08-07 MED ORDER — OXYCODONE-ACETAMINOPHEN 5-325 MG PO TABS: 1.0000 | ORAL_TABLET | Freq: Four times a day (QID) | ORAL | 0 refills | Status: DC | PRN

## 2018-08-08 NOTE — Progress Notes (Signed)
DOS: 08-01-2018 Navicular Exostectomy RT; repair of posterior tibial tendon RT   [facility]

## 2018-08-10 NOTE — Progress Notes (Signed)
   Subjective:  Patient presents today status post navicular exostectomy with repair of the PT tendon right. DOS: 08/01/18. He reports the foot is more painful in the evenings and night time. He has been icing and elevating the foot as well as taking Ibuprofen and Percocet for treatment. Patient is here for further evaluation and treatment.    Past Medical History:  Diagnosis Date  . Thyroid disease       Objective/Physical Exam Neurovascular status intact.  Skin incisions appear to be well coapted with sutures and staples intact. No sign of infectious process noted. No dehiscence. No active bleeding noted. Moderate edema noted to the surgical extremity.  Radiographic Exam:  Orthopedic hardware and osteotomies sites appear to be stable with routine healing.  Assessment: 1. s/p navicular exostectomy with PT tendon repair right. DOS: 08/01/18   Plan of Care:  1. Patient was evaluated. X-rays reviewed 2. Refill prescription for Percocet 5/325 mg provided to patient. 3. Dressing changed. Continue nonweightbearing in CAM boot.  4. Patient may need new custom molded orthotics from Dr. Tasia Catchings office at some point.  5. Return to clinic in one week for staples removal.    Felecia Shelling, DPM [facility]  Dr. Felecia Shelling, DPM    [address]                                        [place], [place] [address]                Office [phone]  Fax [phone]

## 2018-08-19 ENCOUNTER — Encounter: Payer: Managed Care, Other (non HMO) | Admitting: Podiatry

## 2018-08-21 ENCOUNTER — Ambulatory Visit (INDEPENDENT_AMBULATORY_CARE_PROVIDER_SITE_OTHER): Payer: Managed Care, Other (non HMO) | Admitting: Podiatry

## 2018-08-21 DIAGNOSIS — M898X7 Other specified disorders of bone, ankle and foot: Secondary | ICD-10-CM

## 2018-08-21 DIAGNOSIS — Z9889 Other specified postprocedural states: Secondary | ICD-10-CM

## 2018-08-21 DIAGNOSIS — M76821 Posterior tibial tendinitis, right leg: Secondary | ICD-10-CM

## 2018-08-22 ENCOUNTER — Telehealth: Payer: Self-pay | Admitting: *Deleted

## 2018-08-22 ENCOUNTER — Telehealth: Payer: Self-pay | Admitting: Podiatry

## 2018-08-22 NOTE — Telephone Encounter (Signed)
"   I had something pretty significant happen with my foot, while I was sleeping last night, just wanted to speak with the Nurse about it."

## 2018-08-22 NOTE — Telephone Encounter (Signed)
Pt's dtr, picked up 3 felt heel pads - 2 long and 1 short.

## 2018-08-22 NOTE — Telephone Encounter (Signed)
Pt states in office yesterday for suture removal, Dr. Logan Bores removed one pad from his boot and pt had terrible pain in the heel and right side of the foot last night in bed. Pt states he has place the foot in the boot at the angle it would have been when the second pad was in place. I told pt, the pain is possibly due to the stretching of the tendons due to the removal of the pad, that he may want to make a more gradual transition by wearing the boot with one pad until it is uncomfortable, then place additional pad in the boot, and gradually increase his time with the single pad daily. I offered to have a pad at the front reception in [place], and pt states his wife is at work but may be able to have his dtr pick one up and bring to him. Pt denies any redness, swelling or open skin.

## 2018-08-22 NOTE — Telephone Encounter (Signed)
I spoke with pt, on his call back and noted in another telephone message.

## 2018-08-29 NOTE — Progress Notes (Signed)
   Subjective:  Patient presents today status post navicular exostectomy with repair of the PT tendon right. DOS: 08/01/18. He reports an increase in swelling. He denies modifying factors. He is ready for the sutures to be removed. Patient is here for further evaluation and treatment.    Past Medical History:  Diagnosis Date  . Thyroid disease       Objective/Physical Exam Neurovascular status intact.  Skin incisions appear to be well coapted with sutures and staples intact. No sign of infectious process noted. No dehiscence. No active bleeding noted. Moderate edema noted to the surgical extremity.  Assessment: 1. s/p navicular exostectomy with PT tendon repair right. DOS: 08/01/18   Plan of Care:  1. Patient was evaluated.  2. Staples removed.  3. Begin light weightbearing in CAM boot beginning 10/24.  4. Orders for physical therapy three times weekly for four weeks.  5. Return to clinic in 2 weeks.  Going to an [employer] dance music) concert in [place], [place] on November 2nd.    Felecia Shelling, DPM [facility]  Dr. Felecia Shelling, DPM    [address]                                        [place], [place] [address]                Office [phone]  Fax [phone]

## 2018-09-02 ENCOUNTER — Ambulatory Visit (INDEPENDENT_AMBULATORY_CARE_PROVIDER_SITE_OTHER): Payer: Managed Care, Other (non HMO)

## 2018-09-02 ENCOUNTER — Ambulatory Visit (INDEPENDENT_AMBULATORY_CARE_PROVIDER_SITE_OTHER): Payer: Managed Care, Other (non HMO) | Admitting: Podiatry

## 2018-09-02 DIAGNOSIS — Z9889 Other specified postprocedural states: Secondary | ICD-10-CM

## 2018-09-02 DIAGNOSIS — M898X7 Other specified disorders of bone, ankle and foot: Secondary | ICD-10-CM

## 2018-09-02 DIAGNOSIS — M76821 Posterior tibial tendinitis, right leg: Secondary | ICD-10-CM

## 2018-09-03 NOTE — Progress Notes (Signed)
   Subjective:  Patient presents today status post navicular exostectomy with repair of the PT tendon right. DOS: 08/01/18. He reports some burning and tingling of the foot below the incision site. He has been doing physical therapy and using the CAM boot as directed. There are no modifying factors noted. Patient is here for further evaluation and treatment.    Past Medical History:  Diagnosis Date  . Thyroid disease       Objective/Physical Exam Neurovascular status intact.  Skin incisions appear to be well coapted. No sign of infectious process noted. No dehiscence. No active bleeding noted. Moderate edema noted to the surgical extremity.  Radiographic Exam:  Orthopedic hardware and osteotomies sites appear to be stable with routine healing.  Assessment: 1. s/p navicular exostectomy with PT tendon repair right. DOS: 08/01/18   Plan of Care:  1. Patient was evaluated. X-Rays reviewed.  2. Continue weightbearing in CAM boot for two weeks, then resume wearing good shoe gear with custom orthotics.  3. Continue physical therapy.  4. Return to clinic in 4 weeks.   Going to an [employer] dance music) concert in [place], [place] on November 2nd.    Felecia Shelling, DPM [facility]  Dr. Felecia Shelling, DPM    [address]                                        [place], [place] [address]                Office [phone]  Fax [phone]

## 2018-09-04 ENCOUNTER — Encounter: Payer: Managed Care, Other (non HMO) | Admitting: Podiatry

## 2018-09-30 ENCOUNTER — Ambulatory Visit (INDEPENDENT_AMBULATORY_CARE_PROVIDER_SITE_OTHER): Payer: Managed Care, Other (non HMO)

## 2018-09-30 ENCOUNTER — Ambulatory Visit (INDEPENDENT_AMBULATORY_CARE_PROVIDER_SITE_OTHER): Payer: Managed Care, Other (non HMO) | Admitting: Podiatry

## 2018-09-30 DIAGNOSIS — M76821 Posterior tibial tendinitis, right leg: Secondary | ICD-10-CM | POA: Diagnosis not present

## 2018-09-30 DIAGNOSIS — M898X7 Other specified disorders of bone, ankle and foot: Secondary | ICD-10-CM

## 2018-09-30 DIAGNOSIS — Z9889 Other specified postprocedural states: Secondary | ICD-10-CM

## 2018-09-30 NOTE — Progress Notes (Signed)
   Subjective:  Patient presents today status post navicular exostectomy with repair of the PT tendon right. DOS: 08/01/18.  Patient notices significant improvement to the surgical foot.  Swelling and edema is gone in the foot.  He does have some intermittent occasional sharp shooting pains intermittently throughout the day.  He also some complains of sciatica type pain that extends up to the buttocks.  Finally the patient has some issues regarding nausea, low-grade, despite not really taking any oral medication other than Tylenol.    Past Medical History:  Diagnosis Date  . Thyroid disease       Objective/Physical Exam Neurovascular status intact.  Skin incisions healed.  Negative for any edema.  Mild pain on palpation along the surgical incision site.  Range of motion within normal limits with muscle strength 5/5 all compartments.  Radiographic Exam:  Osteotomies sites appear to be stable with routine healing.  Assessment: 1. s/p navicular exostectomy with PT tendon repair right. DOS: 08/01/18   Plan of Care:  1. Patient was evaluated. X-Rays reviewed.  2.  Continue wearing good supportive shoes with custom molded insoles. 3.  Continue physical therapy 4.  I explained to the patient that his sciatica type symptoms are likely due to malalignment during weightbearing in the postoperative recovery.  I explained that a cam boot can throw his back out of alignment and cause sciatica type symptoms.  He should go away with time and especially now that he is back in normal shoe gear.  He states that he would like to see a chiropractor for alignment and believe that may be beneficial. 5.  Return to clinic in 8 weeks   Felecia ShellingBrent M. Ethylene Reznick, DPM [facility]  Dr. Felecia ShellingBrent M. Colbe Viviano, DPM    [address]                                        [place], [place] [address]                Office [phone](336) [phone]  Fax [phone](336) [phone]

## 2018-10-01 ENCOUNTER — Encounter: Payer: Self-pay | Admitting: Podiatry

## 2018-11-18 ENCOUNTER — Ambulatory Visit: Payer: Managed Care, Other (non HMO) | Admitting: Podiatry

## 2018-11-18 ENCOUNTER — Ambulatory Visit (INDEPENDENT_AMBULATORY_CARE_PROVIDER_SITE_OTHER): Payer: Managed Care, Other (non HMO)

## 2018-11-18 DIAGNOSIS — M898X7 Other specified disorders of bone, ankle and foot: Secondary | ICD-10-CM

## 2018-11-18 DIAGNOSIS — M76821 Posterior tibial tendinitis, right leg: Secondary | ICD-10-CM

## 2018-11-18 DIAGNOSIS — M722 Plantar fascial fibromatosis: Secondary | ICD-10-CM

## 2018-11-20 NOTE — Progress Notes (Addendum)
   Subjective:  Patient presents today status post navicular exostectomy with repair of the PT tendon right. DOS: 08/01/18. He states he is doing well overall. He reports a significant amount of continued stiffness of the foot. He denies modifying factors. He has completed physical therapy. Patient is here for further evaluation and treatment.    Past Medical History:  Diagnosis Date  . Thyroid disease       Objective/Physical Exam Neurovascular status intact. Skin incisions healed. Negative for any edema. Range of motion within normal limits with muscle strength 5/5 all compartments.  Radiographic Exam:  Osteotomies sites appear to be stable with routine healing.  Assessment: 1. s/p navicular exostectomy with PT tendon repair right. DOS: 08/01/18 2. Plantar fasciitis bilateral   Plan of Care:  1. Patient was evaluated. X-Rays reviewed.  2. May resume full activity with no restrictions.  3. Recommended good shoe gear.  4. Preauthorization for custom orthotics requested.  5. Return to clinic as needed.    Felecia Shelling, DPM [facility]  Dr. Felecia Shelling, DPM    [address]                                        [place], [place] [address]                Office [phone]  Fax [phone]

## 2018-12-17 ENCOUNTER — Ambulatory Visit (INDEPENDENT_AMBULATORY_CARE_PROVIDER_SITE_OTHER): Payer: Managed Care, Other (non HMO) | Admitting: Orthotics

## 2018-12-17 DIAGNOSIS — M216X1 Other acquired deformities of right foot: Secondary | ICD-10-CM

## 2018-12-17 DIAGNOSIS — M722 Plantar fascial fibromatosis: Secondary | ICD-10-CM

## 2018-12-17 DIAGNOSIS — M898X7 Other specified disorders of bone, ankle and foot: Secondary | ICD-10-CM

## 2018-12-17 DIAGNOSIS — M216X2 Other acquired deformities of left foot: Secondary | ICD-10-CM

## 2018-12-17 DIAGNOSIS — Q6650 Congenital pes planus, unspecified foot: Secondary | ICD-10-CM

## 2018-12-17 DIAGNOSIS — M76821 Posterior tibial tendinitis, right leg: Secondary | ICD-10-CM

## 2018-12-17 NOTE — Progress Notes (Signed)
Patient presents today with a hx of PTTD/AAF.  Upon assessment, patient has pronounced pes planus w/ a valgus RF deformity.  Patient has medially shifted talus/navicular.  Goal is provide longitudinal arch support and RF stability.  Plan on deep heel cup, hug arch, wide foot orthosis w/ medial flange and varus correction for RF valgus deformity.  Patient educated in the progessive nature of PTTD and financial responsibility.  

## 2019-01-21 ENCOUNTER — Encounter: Payer: Managed Care, Other (non HMO) | Admitting: Orthotics

## 2019-05-07 ENCOUNTER — Telehealth: Payer: Self-pay | Admitting: Podiatry

## 2019-05-07 NOTE — Telephone Encounter (Signed)
Pt left message stating he was told one time orthotics would not be covered(denied) and then was told they would be and he just received a bill stating they were denied.  I investigated and pt has [place] and I did call 2 times with different codes the first they said they were not covered and second said with the code given they would be covered. I explained to pt that there is no guarantee of payment and that I have looked in his chart and on the [place] website and have came up with additional codes and our billing is going to resubmit the claim.

## 2019-06-12 ENCOUNTER — Other Ambulatory Visit: Payer: Self-pay

## 2019-06-12 ENCOUNTER — Other Ambulatory Visit: Payer: Self-pay | Admitting: Podiatry

## 2019-06-12 ENCOUNTER — Encounter: Payer: Self-pay | Admitting: Podiatry

## 2019-06-12 ENCOUNTER — Ambulatory Visit: Payer: Managed Care, Other (non HMO) | Admitting: Podiatry

## 2019-06-12 ENCOUNTER — Ambulatory Visit (INDEPENDENT_AMBULATORY_CARE_PROVIDER_SITE_OTHER): Payer: Managed Care, Other (non HMO)

## 2019-06-12 VITALS — Temp 97.6°F

## 2019-06-12 DIAGNOSIS — M779 Enthesopathy, unspecified: Secondary | ICD-10-CM

## 2019-06-12 DIAGNOSIS — M76821 Posterior tibial tendinitis, right leg: Secondary | ICD-10-CM

## 2019-06-17 NOTE — Progress Notes (Signed)
Subjective:   Patient ID: Dylan Mccall, male   DOB: 48 y.o.   MRN: [id number]   HPI Patient had surgery approximately 11 months and has started to increase his activities and has been doing a lot of jumping on his foot and has developed some discomfort   ROS      Objective:  Physical Exam  Neurovascular status intact with incision site right healed well what appears to be a strong posterior tibial tendon but there is discomfort more in the insertion of the tendon and more plantar around the medial aspect of the fascial band with moderate depression of the arch     Assessment:  Part of this may be due to the types of activities he is choosing to do with inflammation pain around the plantar fascia and into the posterior tib tendon     Plan:  H&P x-ray reviewed and different treatment options discussed including reduced activity and moderation of the types of activity he does.  He will stop jumping in 6 utilize more biking and possible tennis and I did do a very careful injection plantar to the posterior tib insertion right 3 mg Kenalog 5 mg Xylocaine and advised on continued orthotic usage and supportive shoes.  Reappoint to recheck  X-rays indicate there has been resection of bone with good alignment noted no indications of pathology on x-ray or depression of the arch signed visit

## 2019-07-02 ENCOUNTER — Encounter: Payer: Self-pay | Admitting: Podiatry

## 2019-07-02 ENCOUNTER — Ambulatory Visit: Payer: Managed Care, Other (non HMO) | Admitting: Podiatry

## 2019-07-02 ENCOUNTER — Other Ambulatory Visit: Payer: Self-pay

## 2019-07-02 DIAGNOSIS — M76821 Posterior tibial tendinitis, right leg: Secondary | ICD-10-CM | POA: Diagnosis not present

## 2019-07-05 NOTE — Progress Notes (Signed)
    HPI: 48 y.o. male presenting today for follow up evaluation of posterior tibial tendinitis of the right lower extremity. He reports continue pain when being active on his feet for long periods of time. He states although he has recurrent pain it is not as severe as it was. He has been wearing the compression sleeve for treatment which helps alleviate the symptoms. Patient is here for further evaluation and treatment.   Past Medical History:  Diagnosis Date  . Thyroid disease        Physical Exam: General: The patient is alert and oriented x3 in no acute distress.  Dermatology: Skin is warm, dry and supple bilateral lower extremities. Negative for open lesions or macerations.  Vascular: Palpable pedal pulses bilaterally. No edema or erythema noted. Capillary refill within normal limits.  Neurological: Epicritic and protective threshold grossly intact bilaterally.   Musculoskeletal Exam: Pain on palpation noted to the posterior tibial tendon of the right foot. Range of motion within normal limits. Muscle strength 5/5 in all muscle groups bilateral lower extremities.  Assessment: 1. Posterior tibial tendinitis right 2. H/o PT tendon repair right    Plan of Care:  1. Patient was evaluated.  2. Appointment with Liliane Channel, Pedorthist, for orthotics modification. 3. Continue wearing good shoe gear.  4. Return to clinic as needed.     Edrick Kins, DPM [facility]  Dr. Edrick Kins, [place] [address]                                        [place], [place] [address]                Office [phone]  Fax [phone]

## 2019-07-15 ENCOUNTER — Ambulatory Visit: Payer: Managed Care, Other (non HMO) | Admitting: Orthotics

## 2019-07-15 ENCOUNTER — Other Ambulatory Visit: Payer: Self-pay

## 2019-07-15 DIAGNOSIS — M76821 Posterior tibial tendinitis, right leg: Secondary | ICD-10-CM

## 2019-07-15 NOTE — Progress Notes (Signed)
Patient complaining of residual PTTD tenderness, arch discomfort from F/O compared to old.  New f/o is about 1/8" higher on rt.   Also need to add met pad 4* medial skive and 3* post 

## 2019-08-05 ENCOUNTER — Other Ambulatory Visit: Payer: Self-pay

## 2019-08-05 ENCOUNTER — Ambulatory Visit: Payer: Managed Care, Other (non HMO) | Admitting: Orthotics

## 2019-08-05 DIAGNOSIS — Q6650 Congenital pes planus, unspecified foot: Secondary | ICD-10-CM

## 2019-08-05 DIAGNOSIS — M779 Enthesopathy, unspecified: Secondary | ICD-10-CM

## 2019-08-05 DIAGNOSIS — M76821 Posterior tibial tendinitis, right leg: Secondary | ICD-10-CM

## 2019-08-05 NOTE — Progress Notes (Signed)
Patient came in today to pick up custom made foot orthotics.  The goals were accomplished and the patient reported no dissatisfaction with said orthotics.  Patient was advised of breakin period and how to report any issues. 

## 2019-12-03 ENCOUNTER — Ambulatory Visit: Payer: 59 | Attending: Internal Medicine

## 2019-12-03 DIAGNOSIS — Z20822 Contact with and (suspected) exposure to covid-19: Secondary | ICD-10-CM

## 2019-12-04 LAB — NOVEL CORONAVIRUS, NAA: SARS-CoV-2, NAA: NOT DETECTED

## 2019-12-10 ENCOUNTER — Ambulatory Visit: Payer: 59 | Attending: Internal Medicine

## 2019-12-10 DIAGNOSIS — Z20822 Contact with and (suspected) exposure to covid-19: Secondary | ICD-10-CM

## 2019-12-11 LAB — NOVEL CORONAVIRUS, NAA: SARS-CoV-2, NAA: NOT DETECTED

## 2020-05-03 ENCOUNTER — Ambulatory Visit: Payer: 59 | Attending: Internal Medicine

## 2020-05-03 DIAGNOSIS — Z20822 Contact with and (suspected) exposure to covid-19: Secondary | ICD-10-CM

## 2020-05-04 LAB — NOVEL CORONAVIRUS, NAA: SARS-CoV-2, NAA: NOT DETECTED

## 2020-05-04 LAB — SARS-COV-2, NAA 2 DAY TAT

## 2020-11-11 ENCOUNTER — Other Ambulatory Visit: Payer: 59

## 2020-11-11 DIAGNOSIS — Z20822 Contact with and (suspected) exposure to covid-19: Secondary | ICD-10-CM

## 2020-11-13 LAB — NOVEL CORONAVIRUS, NAA: SARS-CoV-2, NAA: NOT DETECTED

## 2020-11-13 LAB — SARS-COV-2, NAA 2 DAY TAT

## 2020-11-18 ENCOUNTER — Other Ambulatory Visit: Payer: Self-pay

## 2021-02-07 ENCOUNTER — Ambulatory Visit (INDEPENDENT_AMBULATORY_CARE_PROVIDER_SITE_OTHER): Payer: 59

## 2021-02-07 ENCOUNTER — Ambulatory Visit (INDEPENDENT_AMBULATORY_CARE_PROVIDER_SITE_OTHER): Payer: 59 | Admitting: Podiatry

## 2021-02-07 ENCOUNTER — Other Ambulatory Visit: Payer: Self-pay

## 2021-02-07 DIAGNOSIS — M76821 Posterior tibial tendinitis, right leg: Secondary | ICD-10-CM | POA: Diagnosis not present

## 2021-02-07 DIAGNOSIS — M76822 Posterior tibial tendinitis, left leg: Secondary | ICD-10-CM

## 2021-02-07 DIAGNOSIS — S9032XA Contusion of left foot, initial encounter: Secondary | ICD-10-CM

## 2021-02-07 MED ORDER — MELOXICAM 15 MG PO TABS: 15.0000 mg | ORAL_TABLET | Freq: Every day | ORAL | 1 refills | Status: DC

## 2021-02-16 NOTE — Progress Notes (Signed)
   HPI: 50 y.o. male presenting today for evaluation of an audible popping sound when he is very active on his feet.  He does have a history of navicular exostectomy with posterior tibial tendon repair on the right foot 08/01/2018.  Currently his right foot is completely asymptomatic and doing well patient states that when he is active on his foot he feels a popping sensation and has slight discomfort to his left foot  Past Medical History:  Diagnosis Date  . Thyroid disease      Physical Exam: General: The patient is alert and oriented x3 in no acute distress.  Dermatology: Skin is warm, dry and supple bilateral lower extremities. Negative for open lesions or macerations.  Vascular: Palpable pedal pulses bilaterally. No edema or erythema noted. Capillary refill within normal limits.  Neurological: Epicritic and protective threshold grossly intact bilaterally.   Musculoskeletal Exam: Range of motion within normal limits to all pedal and ankle joints bilateral. Muscle strength 5/5 in all groups bilateral.   Radiographic Exam:  Normal osseous mineralization. Joint spaces preserved. No fracture/dislocation/boney destruction.    Assessment: 1.  Popping sensation left dorsal foot; asymptomatic   Plan of Care:  1. Patient evaluated. X-Rays reviewed.  2.  Prescription for meloxicam 15 mg daily as needed 3.  Continue custom molded orthotics 4.  Return to clinic as needed      Felecia Shelling, DPM [facility]  Dr. Felecia Shelling, DPM    [address] [place], [place] [address]                Office [phone]  Fax [phone]

## 2021-04-12 ENCOUNTER — Other Ambulatory Visit: Payer: Self-pay | Admitting: Home Modifications

## 2021-04-12 DIAGNOSIS — K5730 Diverticulosis of large intestine without perforation or abscess without bleeding: Secondary | ICD-10-CM

## 2021-04-12 DIAGNOSIS — R10819 Abdominal tenderness, unspecified site: Secondary | ICD-10-CM

## 2021-04-22 ENCOUNTER — Ambulatory Visit
Admission: RE | Admit: 2021-04-22 | Discharge: 2021-04-22 | Disposition: A | Discharge status: Home / Self Care | Payer: 59 | Source: Ambulatory Visit | Attending: Home Modifications | Admitting: Home Modifications

## 2021-04-22 ENCOUNTER — Other Ambulatory Visit: Payer: Self-pay

## 2021-04-22 DIAGNOSIS — R10819 Abdominal tenderness, unspecified site: Secondary | ICD-10-CM

## 2021-04-22 DIAGNOSIS — K5730 Diverticulosis of large intestine without perforation or abscess without bleeding: Secondary | ICD-10-CM

## 2021-04-22 MED ORDER — IOPAMIDOL (ISOVUE-370) INJECTION 76%
80.0000 mL | Freq: Once | INTRAVENOUS | Status: AC | PRN
  Administered 2021-04-22: 80 mL via INTRAVENOUS

## 2021-12-08 ENCOUNTER — Encounter: Payer: Self-pay | Admitting: Internal Medicine

## 2021-12-08 ENCOUNTER — Ambulatory Visit: Payer: 59 | Admitting: Internal Medicine

## 2021-12-08 ENCOUNTER — Other Ambulatory Visit: Payer: Self-pay

## 2021-12-08 VITALS — BP 118/78 | HR 74 | Ht 70.0 in | Wt 232.8 lb

## 2021-12-08 DIAGNOSIS — R55 Syncope and collapse: Secondary | ICD-10-CM | POA: Diagnosis not present

## 2021-12-08 NOTE — Patient Instructions (Signed)
Medication Instructions:  °Continue same medications ° ° °Lab Work: °None ordered ° ° °Testing/Procedures: °None ordered ° ° °Follow-Up: °At [facility], you and your health needs are our priority.  As part of our continuing mission to provide you with exceptional heart care, we have created designated Provider Care Teams.  These Care Teams include your primary Cardiologist (physician) and Advanced Practice Providers (APPs -  Physician Assistants and Nurse Practitioners) who all work together to provide you with the care you need, when you need it. ° °We recommend signing up for the patient portal called "MyChart".  Sign up information is provided on this After Visit Summary.  MyChart is used to connect with patients for Virtual Visits (Telemedicine).  Patients are able to view lab/test results, encounter notes, upcoming appointments, etc.  Non-urgent messages can be sent to your provider as well.   °To learn more about what you can do with MyChart, go to [email or web address].   ° °Your next appointment:  As Needed °  ° °The format for your next appointment: Office  ° ° °Provider:  Dr.Branch ° ° °

## 2021-12-08 NOTE — Progress Notes (Signed)
Cardiology Office Note:    Date:  12/08/2021   ID:  Dylan Mccall, DOB [birth year], MRN [id number]  PCP:  Jarrett Soho, PA-C   [facility] Providers Cardiologist:  None     Referring MD: Jarrett Soho, PA-C   No chief complaint on file. Syncope  History of Present Illness:    Dylan Mccall is a 51 y.o. male with a hx of thyroid dx, family hx of premature CAD referral for syncopal episode   He was at a concert in [place]. He started to feel nauseas and he noted a syncopal episode. He was seen by EMS. He states once a month when he gets up to the restroom feels he could fall.  He denies persistent palpitations, sometimes at night. He denies sob or chest pressure. No hx of stroke or diabetes. He does personal training 3x per week.  He has no limitations with this. He notes TSH was low and has changed his synthroid.   His father had bypass surgery at 44 and his mother has an arrhythmia.  No hx of SCD.  He denies smoking. He does social drinking.  Saw Dr. Eden Emms in 2016 for multiple complaint and presyncope. Diagnosed with dehydration.   Past Medical History:  Diagnosis Date   Thyroid disease     No past surgical history on file.  Current Medications: Current Meds  Medication Sig   Cetirizine HCl 10 MG CAPS Take 1 capsule by mouth daily as needed (for allergies).   fluticasone (FLONASE) 50 MCG/ACT nasal spray Place 2 sprays into both nostrils daily.   ibuprofen (ADVIL,MOTRIN) 200 MG tablet Take 400 mg by mouth every 6 (six) hours as needed for moderate pain.   lansoprazole (PREVACID) 30 MG capsule Take 30 mg by mouth daily.   linaclotide (LINZESS) 72 MCG capsule TAKE 1 CAPSULE BY MOUTH AT  LEAST 30 MINUTES BEFORE  FIRST MEAL OF THE DAY ON AN EMPTY STOMACH ONCE DAILY   meloxicam (MOBIC) 15 MG tablet Take 1 tablet (15 mg total) by mouth daily.   omeprazole (PRILOSEC) 40 MG capsule    SYNTHROID 175 MCG tablet 1/2 TABLET ON AN EMPTY STOMACH IN THE MORNING MONDAY, WED, FRIDAY,  1 TABLET ALL OTHER DAYS     Allergies:   Erythromycin   Social History   Socioeconomic History   Marital status: Married    Spouse name: Not on file   Number of children: Not on file   Years of education: Not on file   Highest education level: Not on file  Occupational History   Not on file  Tobacco Use   Smoking status: Never   Smokeless tobacco: Never  Substance and Sexual Activity   Alcohol use: No   Drug use: No   Sexual activity: Not on file  Other Topics Concern   Not on file  Social History Narrative   Not on file   Social Determinants of Health   Financial Resource Strain: Not on file  Food Insecurity: Not on file  Transportation Needs: Not on file  Physical Activity: Not on file  Stress: Not on file  Social Connections: Not on file     Family History: The patient's family history includes Diabetes type II in his mother; Heart disease in his father; Rheum arthritis in his sister.  ROS:   Please see the history of present illness.     All other systems reviewed and are negative.  EKGs/Labs/Other Studies Reviewed:    The following studies were reviewed today:  EKG:  EKG is  ordered today.  The ekg ordered today demonstrates   NSR  Recent Labs: No results found for requested labs within last 8760 hours.  Recent Lipid Panel No results found for: CHOL, TRIG, HDL, CHOLHDL, VLDL, LDLCALC, LDLDIRECT   Risk Assessment/Calculations:           Physical Exam:    VS:    Vitals:   12/08/21 0807  BP: 118/78  Pulse: 74  SpO2: 96%     Wt Readings from Last 3 Encounters:  12/08/21 232 lb 12.8 oz (105.6 kg)  03/29/15 233 lb (105.7 kg)  08/13/14 218 lb (98.9 kg)     GEN:  Well nourished, well developed in no acute distress HEENT: Normal NECK: No JVD; No carotid bruits LYMPHATICS: No lymphadenopathy CARDIAC: RRR, no murmurs, rubs, gallops RESPIRATORY:  Clear to auscultation without rales, wheezing or rhonchi  ABDOMEN: Soft, non-tender,  non-distended MUSCULOSKELETAL:  No edema; No deformity  SKIN: Warm and dry NEUROLOGIC:  Alert and oriented x 3 PSYCHIATRIC:  Normal affect   ASSESSMENT:    Syncope: likely orthostatic or vasovagal. He had a prodrome prior to this incident. He does not have high risk features including syncope c/f arrhythmia , family hx of SCD, or abnormalities on her EKG. He has no anginal symptoms. No significant CVD risk factors. He is overweight and plans to work on lifestyle and diet. We discussed signs of CAD and if they occur or he has persistent syncopal episodes, he can let us know   PLAN:    In order of problems listed above:  Follow up PRN       Medication Adjustments/Labs and Tests Ordered: Current medicines are reviewed at length with the patient today.  Concerns regarding medicines are outlined above.  Orders Placed This Encounter  Procedures   EKG 12-Lead   No orders of the defined types were placed in this encounter.   Patient Instructions  Medication Instructions:  Continue same medications   Lab Work: None ordered   Testing/Procedures: None ordered   Follow-Up: At [facility], you and your health needs are our priority.  As part of our continuing mission to provide you with exceptional heart care, we have created designated Provider Care Teams.  These Care Teams include your primary Cardiologist (physician) and Advanced Practice Providers (APPs -  Physician Assistants and Nurse Practitioners) who all work together to provide you with the care you need, when you need it.  We recommend signing up for the patient portal called "MyChart".  Sign up information is provided on this After Visit Summary.  MyChart is used to connect with patients for Virtual Visits (Telemedicine).  Patients are able to view lab/test results, encounter notes, upcoming appointments, etc.  Non-urgent messages can be sent to your provider as well.   To learn more about what you can do with MyChart,  go to [email or web address].    Your next appointment:  As Needed    The format for your next appointment:Office     Provider:  Dr.Terel Bann     Signed, Maisie Fus, MD  12/08/2021 8:53 AM    [facility]

## 2022-01-14 IMAGING — CT CT ABD-PELV W/ CM
1 of 3 series · 12 of 32 positions shown, 18 images · IV contrast (APPLIED)
Comparison: CT 12/04/2008

CLINICAL DATA: 50-year-old with lower abdominal pain.

EXAM:
CT ABDOMEN AND PELVIS WITH CONTRAST
TECHNIQUE: Multidetector CT imaging of the abdomen and pelvis was performed
using the standard protocol following bolus administration of
intravenous contrast.
CONTRAST:  80mL CZDHOF-T9B IOPAMIDOL (CZDHOF-T9B) INJECTION 76%

[Series 2: abd/pelvis w/cm · axial · 0.90mm/px · z∈[-524,-109]mm · 12 of 97 slices shown, 18 images]
[im 7/97  soft-tissue]
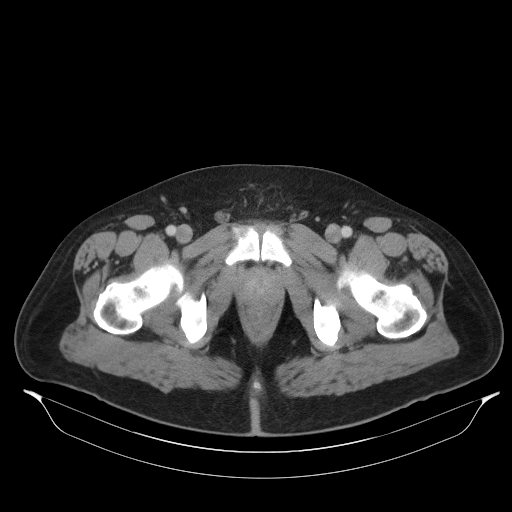
[im 7/97  bone]
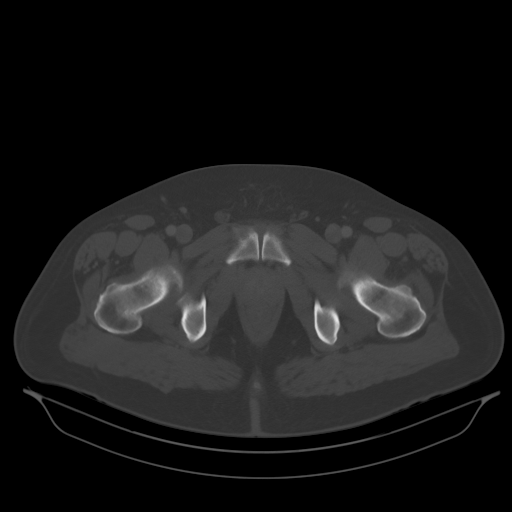
[im 14/97  soft-tissue]
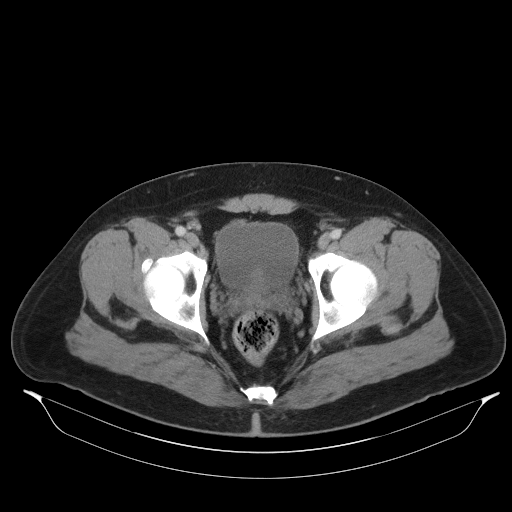
[im 21/97  soft-tissue]
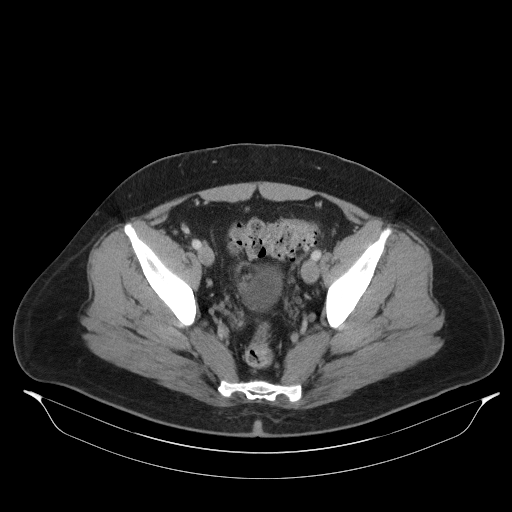
[im 28/97  soft-tissue]
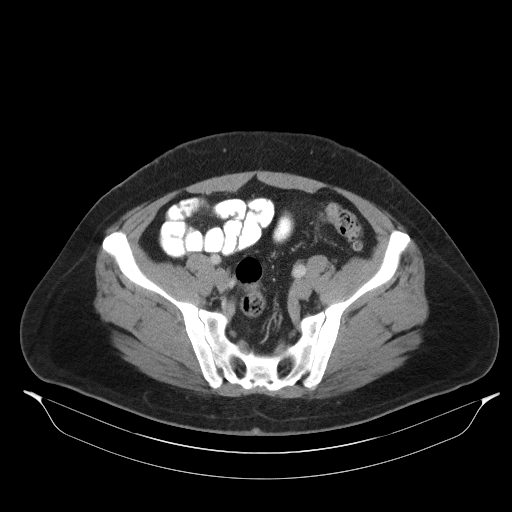
[im 35/97  soft-tissue]
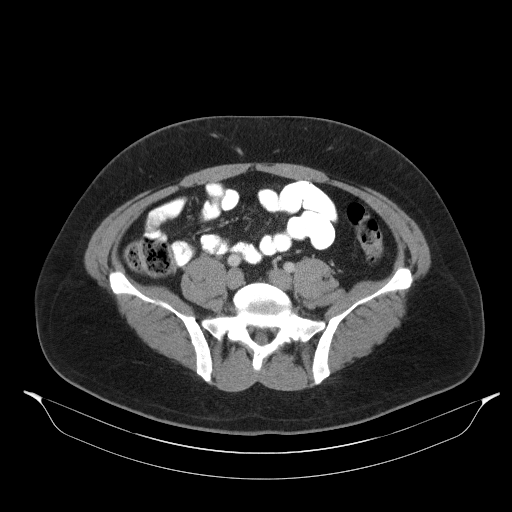
[im 42/97  soft-tissue]
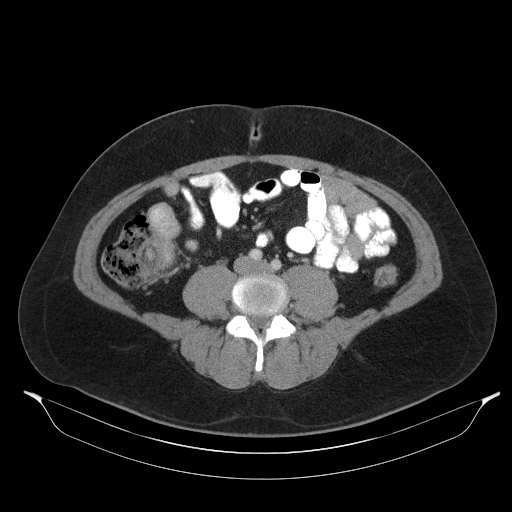
[im 55/97  soft-tissue]
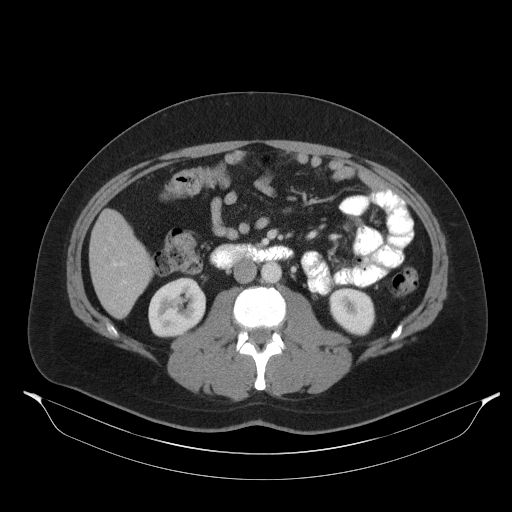
[im 62/97  soft-tissue]
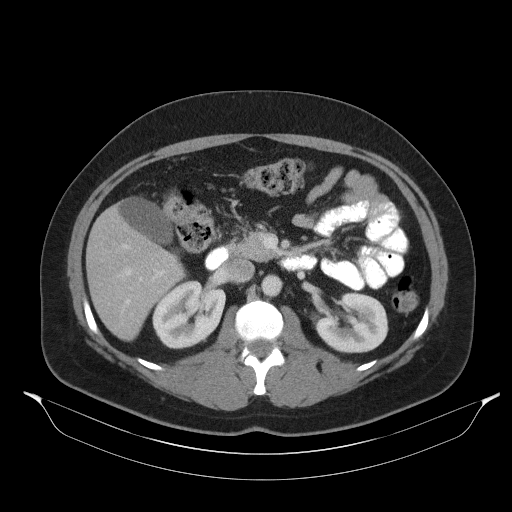
[im 69/97  soft-tissue]
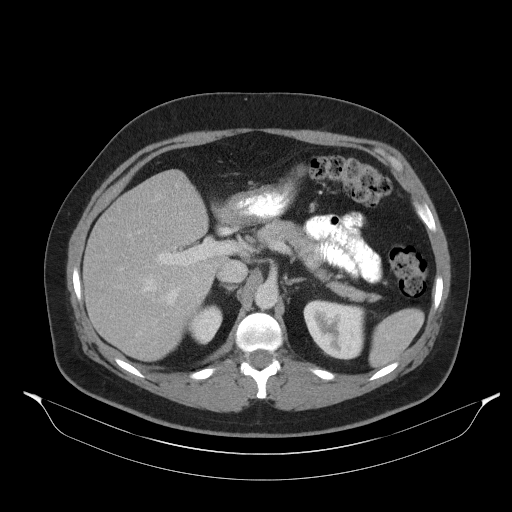
[im 69/97  lung]
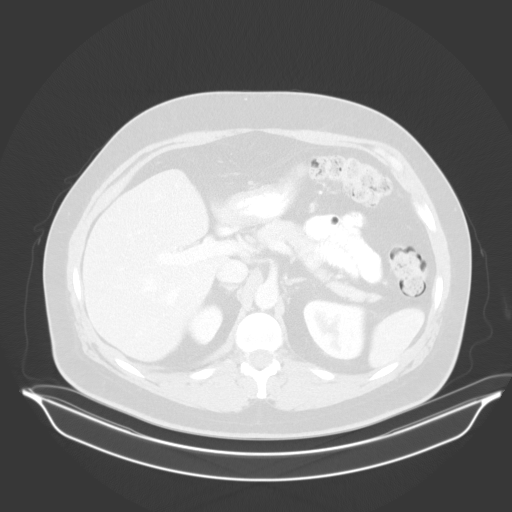
[im 69/97  bone]
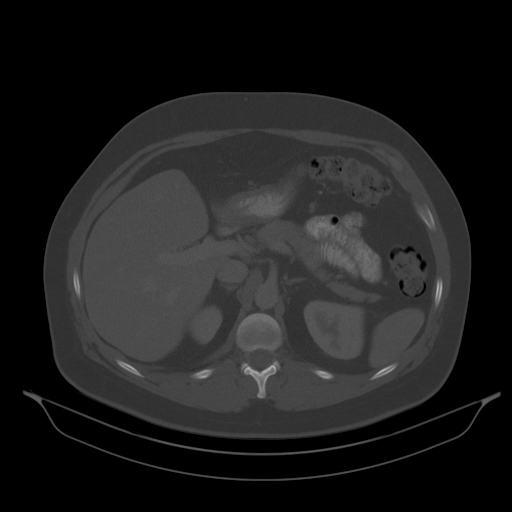
[im 76/97  soft-tissue]
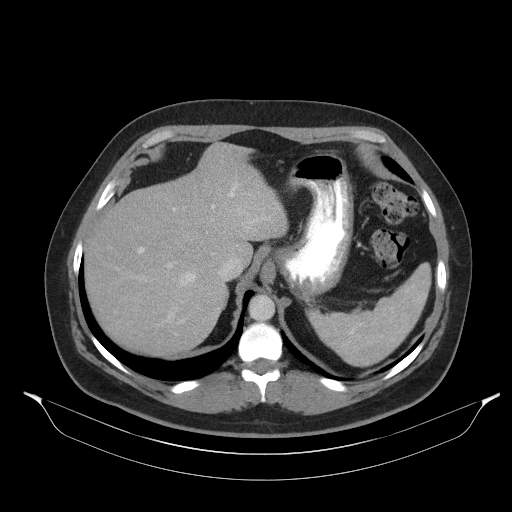
[im 76/97  lung]
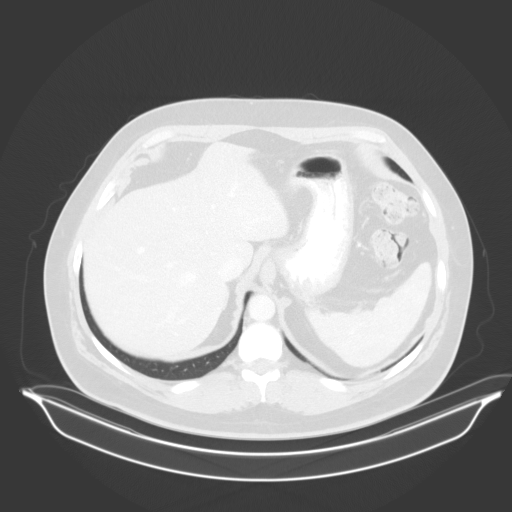
[im 83/97  soft-tissue]
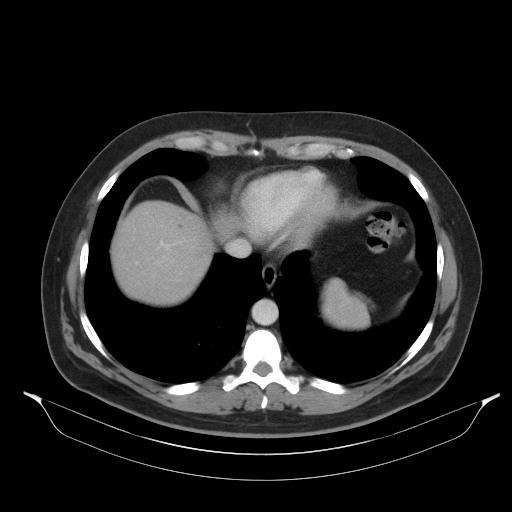
[im 83/97  lung]
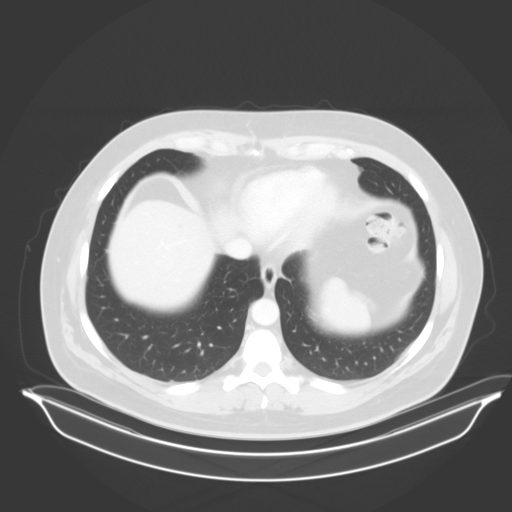
[im 90/97  soft-tissue]
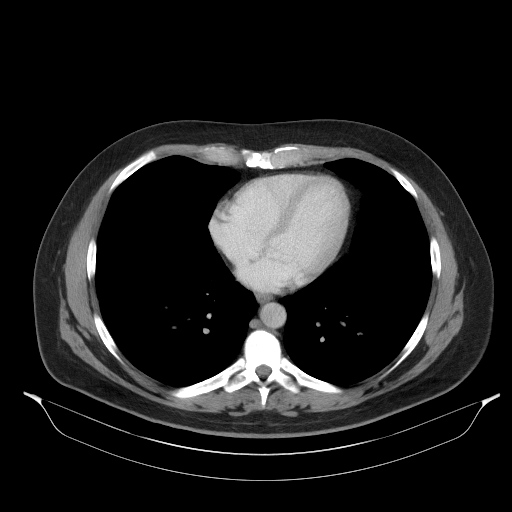
[im 90/97  lung]
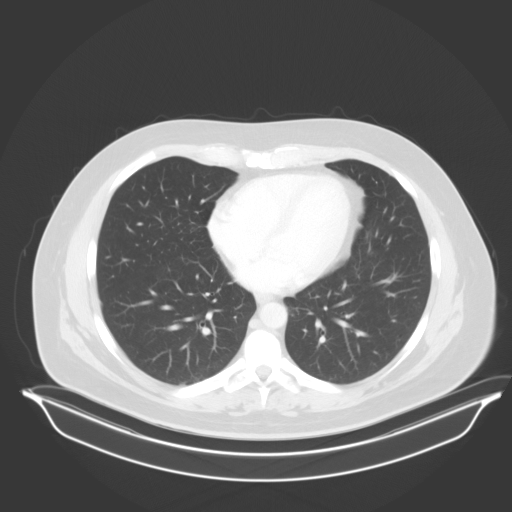

[12 of 32 positions shown; findings below may reference images not displayed]

FINDINGS: Lower chest: Peripheral 4 mm nodule in the left lower lobe on
sequence 3, image 33 has minimally changed since 9474 and most
compatible with a benign nodule. Few patchy densities in the lingula
could represent scarring or atelectasis. There is a questionable
small nodular density in this area that roughly measures 4 mm on
sequence 3, image 23.

Hepatobiliary: Tiny hypodensity at the hepatic dome is likely an
incidental finding. No suspicious liver findings. Normal appearance
of the gallbladder. Main portal venous system is patent. No biliary
dilatation.

Pancreas: Unremarkable. No pancreatic ductal dilatation or
surrounding inflammatory changes.

Spleen: Normal in size without focal abnormality.

Adrenals/Urinary Tract: Normal appearance of the adrenal glands.
Normal appearance of both kidneys without hydronephrosis. Normal
appearance of the urinary bladder.

Stomach/Bowel: Diverticulosis most prominent in the sigmoid colon.
Normal appendix. Normal appearance of the small bowel. Normal
appearance of the stomach. No evidence for bowel obstruction.
Questionable pericolonic stranding involving the sigmoid colon on
sequence 2, image 73. A very small focus of diverticulitis cannot be
excluded. There is no evidence for an extraluminal collection or
abscess in this area.

Vascular/Lymphatic: No significant vascular findings are present. No
enlarged abdominal or pelvic lymph nodes.

Reproductive: Prostate is slightly prominent measuring 4.7 cm.

Other: Negative for free fluid. Negative for free air. Tiny
umbilical hernia containing fat.

Musculoskeletal: No acute bone abnormality.
IMPRESSION: 1. Colonic diverticulosis with a small area of pericolonic stranding
adjacent to the sigmoid colon. A small focus of diverticulitis
cannot be excluded.
2. Prostate is prominent for size.
3. Indeterminate 4 mm nodular density in the lingula. No follow-up
needed if patient is low-risk. Non-contrast chest CT can be
considered in 12 months if patient is high-risk. This recommendation
follows the consensus statement: Guidelines for Management of
Incidental Pulmonary Nodules Detected on CT Images: From the

These results will be called to the ordering clinician or
representative by the Radiologist Assistant, and communication
documented in the PACS or [REDACTED].

## 2022-06-19 DIAGNOSIS — K5792 Diverticulitis of intestine, part unspecified, without perforation or abscess without bleeding: Secondary | ICD-10-CM | POA: Diagnosis not present

## 2022-06-26 DIAGNOSIS — K5792 Diverticulitis of intestine, part unspecified, without perforation or abscess without bleeding: Secondary | ICD-10-CM | POA: Diagnosis not present

## 2022-08-11 ENCOUNTER — Ambulatory Visit (INDEPENDENT_AMBULATORY_CARE_PROVIDER_SITE_OTHER): Payer: BC Managed Care – PPO

## 2022-08-11 ENCOUNTER — Ambulatory Visit: Payer: BC Managed Care – PPO | Admitting: Podiatry

## 2022-08-11 DIAGNOSIS — M7742 Metatarsalgia, left foot: Secondary | ICD-10-CM

## 2022-08-11 NOTE — Progress Notes (Signed)
   Chief Complaint  Patient presents with   Foot Pain    Patient is here for left foot pain.    HPI: 51 y.o. male presenting today for new complaint of pain and tenderness to the left foot that began about 3-4 weeks ago.  Idiopathic onset.  He has not done anything for treatment.  He says that over the last few days the pain has resolved.  He only has slight sensitivity to the area.  He wears good supportive shoes.  Past Medical History:  Diagnosis Date   Thyroid disease     No past surgical history on file.  Allergies  Allergen Reactions   Erythromycin Nausea And Vomiting     Physical Exam: General: The patient is alert and oriented x3 in no acute distress.  Dermatology: Skin is warm, dry and supple bilateral lower extremities. Negative for open lesions or macerations.  Vascular: Palpable pedal pulses bilaterally. Capillary refill within normal limits.  Negative for any significant edema or erythema  Neurological: Light touch and protective threshold grossly intact  Musculoskeletal Exam: No pedal deformities noted.  There is some tenderness to palpation and range of motion to the second third and fourth MTP joints of the left foot  Radiographic Exam:  Normal osseous mineralization. Joint spaces preserved. No fracture/dislocation/boney destruction.  No stress fracture noted  Assessment: 1.  Metatarsalgia left forefoot   Plan of Care:  1. Patient evaluated. X-Rays reviewed.  2.  Recommend good supportive shoes and sneakers.  Patient currently wearing Hoka's today 3.  Continue OTC Aleve or Advil as needed 4.  Return to clinic as needed      Edrick Kins, DPM [facility]  Dr. Edrick Kins, DPM    [address], [place] [address]                Office [phone]  Fax [phone]

## 2022-10-11 ENCOUNTER — Other Ambulatory Visit (HOSPITAL_COMMUNITY): Payer: Self-pay | Admitting: Family Medicine

## 2022-10-11 DIAGNOSIS — R1032 Left lower quadrant pain: Secondary | ICD-10-CM

## 2022-10-12 ENCOUNTER — Encounter (HOSPITAL_COMMUNITY): Payer: Self-pay

## 2022-10-12 ENCOUNTER — Ambulatory Visit (HOSPITAL_COMMUNITY): Payer: BC Managed Care – PPO

## 2022-12-04 ENCOUNTER — Other Ambulatory Visit (HOSPITAL_COMMUNITY): Payer: Self-pay | Admitting: Gastroenterology

## 2022-12-04 DIAGNOSIS — R1032 Left lower quadrant pain: Secondary | ICD-10-CM

## 2022-12-04 DIAGNOSIS — Z8719 Personal history of other diseases of the digestive system: Secondary | ICD-10-CM

## 2022-12-07 ENCOUNTER — Telehealth (HOSPITAL_BASED_OUTPATIENT_CLINIC_OR_DEPARTMENT_OTHER): Payer: Self-pay

## 2022-12-07 ENCOUNTER — Encounter (HOSPITAL_BASED_OUTPATIENT_CLINIC_OR_DEPARTMENT_OTHER): Payer: Self-pay

## 2022-12-07 ENCOUNTER — Ambulatory Visit (HOSPITAL_BASED_OUTPATIENT_CLINIC_OR_DEPARTMENT_OTHER): Admission: RE | Admit: 2022-12-07 | Payer: BC Managed Care – PPO | Source: Ambulatory Visit

## 2022-12-07 ENCOUNTER — Other Ambulatory Visit: Payer: Self-pay | Admitting: Gastroenterology

## 2022-12-07 DIAGNOSIS — K5732 Diverticulitis of large intestine without perforation or abscess without bleeding: Secondary | ICD-10-CM

## 2022-12-07 DIAGNOSIS — R1032 Left lower quadrant pain: Secondary | ICD-10-CM

## 2022-12-07 DIAGNOSIS — Z8719 Personal history of other diseases of the digestive system: Secondary | ICD-10-CM

## 2022-12-08 ENCOUNTER — Ambulatory Visit (HOSPITAL_BASED_OUTPATIENT_CLINIC_OR_DEPARTMENT_OTHER): Payer: BC Managed Care – PPO

## 2022-12-13 ENCOUNTER — Other Ambulatory Visit: Payer: BC Managed Care – PPO

## 2023-01-17 ENCOUNTER — Encounter (HOSPITAL_BASED_OUTPATIENT_CLINIC_OR_DEPARTMENT_OTHER): Payer: Self-pay | Admitting: *Deleted

## 2023-01-17 ENCOUNTER — Emergency Department (HOSPITAL_BASED_OUTPATIENT_CLINIC_OR_DEPARTMENT_OTHER): Payer: BC Managed Care – PPO

## 2023-01-17 ENCOUNTER — Other Ambulatory Visit: Payer: Self-pay

## 2023-01-17 ENCOUNTER — Inpatient Hospital Stay (HOSPITAL_BASED_OUTPATIENT_CLINIC_OR_DEPARTMENT_OTHER)
Admission: EM | Admit: 2023-01-17 | Discharge: 2023-01-21 | DRG: 392 | Disposition: A | Discharge status: Home / Self Care | Payer: BC Managed Care – PPO | Attending: Internal Medicine | Admitting: Internal Medicine

## 2023-01-17 DIAGNOSIS — K5720 Diverticulitis of large intestine with perforation and abscess without bleeding: Secondary | ICD-10-CM | POA: Diagnosis not present

## 2023-01-17 DIAGNOSIS — Z791 Long term (current) use of non-steroidal anti-inflammatories (NSAID): Secondary | ICD-10-CM

## 2023-01-17 DIAGNOSIS — K5792 Diverticulitis of intestine, part unspecified, without perforation or abscess without bleeding: Secondary | ICD-10-CM | POA: Diagnosis present

## 2023-01-17 DIAGNOSIS — R109 Unspecified abdominal pain: Secondary | ICD-10-CM | POA: Diagnosis not present

## 2023-01-17 DIAGNOSIS — K5900 Constipation, unspecified: Secondary | ICD-10-CM | POA: Diagnosis present

## 2023-01-17 DIAGNOSIS — N3289 Other specified disorders of bladder: Secondary | ICD-10-CM | POA: Diagnosis present

## 2023-01-17 DIAGNOSIS — Z79890 Hormone replacement therapy (postmenopausal): Secondary | ICD-10-CM

## 2023-01-17 DIAGNOSIS — K5909 Other constipation: Secondary | ICD-10-CM | POA: Diagnosis present

## 2023-01-17 DIAGNOSIS — D72829 Elevated white blood cell count, unspecified: Secondary | ICD-10-CM

## 2023-01-17 DIAGNOSIS — E669 Obesity, unspecified: Secondary | ICD-10-CM | POA: Diagnosis present

## 2023-01-17 DIAGNOSIS — Z79899 Other long term (current) drug therapy: Secondary | ICD-10-CM

## 2023-01-17 DIAGNOSIS — Z6832 Body mass index (BMI) 32.0-32.9, adult: Secondary | ICD-10-CM

## 2023-01-17 DIAGNOSIS — Z8249 Family history of ischemic heart disease and other diseases of the circulatory system: Secondary | ICD-10-CM

## 2023-01-17 DIAGNOSIS — Z833 Family history of diabetes mellitus: Secondary | ICD-10-CM

## 2023-01-17 DIAGNOSIS — E039 Hypothyroidism, unspecified: Secondary | ICD-10-CM | POA: Diagnosis present

## 2023-01-17 DIAGNOSIS — K219 Gastro-esophageal reflux disease without esophagitis: Secondary | ICD-10-CM | POA: Diagnosis present

## 2023-01-17 DIAGNOSIS — K5780 Diverticulitis of intestine, part unspecified, with perforation and abscess without bleeding: Secondary | ICD-10-CM

## 2023-01-17 HISTORY — DX: Diverticulitis of intestine, part unspecified, without perforation or abscess without bleeding: K57.92

## 2023-01-17 LAB — CBC WITH DIFFERENTIAL/PLATELET
Abs Immature Granulocytes: 0.05 10*3/uL (ref 0.00–0.07)
Basophils Absolute: 0.1 10*3/uL (ref 0.0–0.1)
Basophils Relative: 0 %
Eosinophils Absolute: 0.2 10*3/uL (ref 0.0–0.5)
Eosinophils Relative: 1 %
HCT: 49 % (ref 39.0–52.0)
Hemoglobin: 16 g/dL (ref 13.0–17.0)
Immature Granulocytes: 0 %
Lymphocytes Relative: 15 %
Lymphs Abs: 2.2 10*3/uL (ref 0.7–4.0)
MCH: 29.4 pg (ref 26.0–34.0)
MCHC: 32.7 g/dL (ref 30.0–36.0)
MCV: 90.1 fL (ref 80.0–100.0)
Monocytes Absolute: 0.9 10*3/uL (ref 0.1–1.0)
Monocytes Relative: 6 %
Neutro Abs: 11.4 10*3/uL — ABNORMAL HIGH (ref 1.7–7.7)
Neutrophils Relative %: 78 %
Platelets: 256 10*3/uL (ref 150–400)
RBC: 5.44 MIL/uL (ref 4.22–5.81)
RDW: 13.5 % (ref 11.5–15.5)
WBC: 14.8 10*3/uL — ABNORMAL HIGH (ref 4.0–10.5)
nRBC: 0 % (ref 0.0–0.2)

## 2023-01-17 LAB — COMPREHENSIVE METABOLIC PANEL
ALT: 19 U/L (ref 0–44)
AST: 27 U/L (ref 15–41)
Albumin: 4.4 g/dL (ref 3.5–5.0)
Alkaline Phosphatase: 63 U/L (ref 38–126)
Anion gap: 7 (ref 5–15)
BUN: 19 mg/dL (ref 6–20)
CO2: 26 mmol/L (ref 22–32)
Calcium: 9.5 mg/dL (ref 8.9–10.3)
Chloride: 104 mmol/L (ref 98–111)
Creatinine, Ser: 0.92 mg/dL (ref 0.61–1.24)
GFR, Estimated: >60 mL/min (ref >60)
Glucose, Bld: 92 mg/dL (ref 70–99)
Potassium: 4.2 mmol/L (ref 3.5–5.1)
Sodium: 137 mmol/L (ref 135–145)
Total Bilirubin: 0.6 mg/dL (ref 0.3–1.2)
Total Protein: 7.4 g/dL (ref 6.5–8.1)

## 2023-01-17 LAB — URINALYSIS, ROUTINE W REFLEX MICROSCOPIC
Bilirubin Urine: NEGATIVE
Glucose, UA: NEGATIVE mg/dL
Hgb urine dipstick: NEGATIVE
Ketones, ur: NEGATIVE mg/dL
Leukocytes,Ua: NEGATIVE
Nitrite: NEGATIVE
Protein, ur: NEGATIVE mg/dL
Specific Gravity, Urine: 1.017 (ref 1.005–1.030)
pH: 6 (ref 5.0–8.0)

## 2023-01-17 LAB — LACTIC ACID, PLASMA: Lactic Acid, Venous: 1.5 mmol/L (ref 0.5–1.9)

## 2023-01-17 LAB — LIPASE, BLOOD: Lipase: 29 U/L (ref 11–51)

## 2023-01-17 MED ORDER — IOHEXOL 350 MG/ML SOLN
100.0000 mL | Freq: Once | INTRAVENOUS | Status: AC | PRN
  Administered 2023-01-17: 80 mL via INTRAVENOUS

## 2023-01-17 MED ORDER — PIPERACILLIN-TAZOBACTAM 3.375 G IVPB
3.3750 g | Freq: Three times a day (TID) | INTRAVENOUS | Status: DC
  Administered 2023-01-18 – 2023-01-21 (×10): 3.375 g via INTRAVENOUS
  Filled 2023-01-17 (×10): qty 50

## 2023-01-17 MED ORDER — KETOROLAC TROMETHAMINE 15 MG/ML IJ SOLN
15.0000 mg | Freq: Once | INTRAMUSCULAR | Status: AC
  Administered 2023-01-17: 15 mg via INTRAVENOUS
  Filled 2023-01-17: qty 1

## 2023-01-17 MED ORDER — PIPERACILLIN-TAZOBACTAM 3.375 G IVPB
3.3750 g | Freq: Once | INTRAVENOUS | Status: AC
  Administered 2023-01-17: 3.375 g via INTRAVENOUS
  Filled 2023-01-17: qty 50

## 2023-01-17 MED ORDER — ONDANSETRON HCL 4 MG/2ML IJ SOLN
4.0000 mg | Freq: Once | INTRAMUSCULAR | Status: AC
  Administered 2023-01-17: 4 mg via INTRAVENOUS
  Filled 2023-01-17: qty 2

## 2023-01-17 MED ORDER — SODIUM CHLORIDE 0.9 % IV BOLUS
1000.0000 mL | Freq: Once | INTRAVENOUS | Status: AC
  Administered 2023-01-17: 1000 mL via INTRAVENOUS

## 2023-01-17 NOTE — Progress Notes (Signed)
Pharmacy Antibiotic Note  Dylan Mccall is a 52 y.o. male admitted on 01/17/2023 with  intra-abdominal infection .  Pharmacy has been consulted for Zosyn dosing.   SCr 0.92 and CrCl 115 ml/min. WBC 14.8 and afebrile.  Plan: Zosyn 3.375g IV q8h (4 hour infusion). Monitor for signs and symptoms of infection Monitor for changes in renal function  Height: '5\' 10"'$  (177.8 cm) Weight: 103.4 kg (228 lb) IBW/kg (Calculated) : 73  Temp (24hrs), Avg:98.4 F (36.9 C), Min:98.4 F (36.9 C), Max:98.4 F (36.9 C)  Recent Labs  Lab 01/17/23 2041  WBC 14.8*  CREATININE 0.92    Estimated Creatinine Clearance: 115.8 mL/min (by C-G formula based on SCr of 0.92 mg/dL).    Allergies  Allergen Reactions   Erythromycin Nausea And Vomiting   Sulfa Antibiotics    Antimicrobials this admission: Zosyn 3/13 >>   Dose adjustments this admission:  Microbiology results:  Thank you for allowing pharmacy to be a part of this patient's care.  Jeneen Rinks [id number] PM

## 2023-01-17 NOTE — Progress Notes (Signed)
Plan of Care Note for accepted transfer   Patient: Dylan Mccall MRN: [id number]   DOA: 01/17/2023  Facility requesting transfer: [facility] Requesting Provider: Dr. Pearline Cables, [place] Reason for transfer: Acute sigmoid diverticulitis with perforation.  Facility course: The patient is a 52 year old male with past medical history significant for chronic constipation on Linzess, GERD, hypothyroidism, history of colon diverticulosis and diverticulitis, seen in the past by Dr. Therisa Doyne, who presented to [facility] ED with complaints of sudden onset sharp left lower quadrant abdominal pain, radiating to his left flank.  Associated with nausea without vomiting.  His pain became increasingly worse.  He went to his PCP initially who directed him to come to the ER for further evaluation.  In the ED, workup revealed acute sigmoid diverticulitis with perforation seen on CT scan.  EDP discussed the case with GI Dr. Deno Etienne and with general surgery Dr. Rosendo Gros.  Recommended IV antibiotics and admission by the hospitalist service.  They will follow in consultation.  IV Zosyn was initiated in the ED.  Additionally, the patient received IV Toradol 15 mg x 1, IV Zofran, and 1 L IV fluid bolus NS.  The patient was admitted by [facility], hospitalist service, to [facility] telemetry surgical unit as inpatient status.  Please contact general surgery once the patient arrives to the hospital.  Plan of care: The patient is accepted for admission to Telemetry unit, at [facility]  Author: Kayleen Memos, DO 01/17/2023  Check [email or web address] for on-call coverage.  Nursing staff, Please call [place] number on Amion as soon as patient's arrival, so appropriate admitting provider can evaluate the pt.

## 2023-01-17 NOTE — ED Triage Notes (Signed)
Pt to ED by POV from home c/o LLQ abdominal pain which began a couple of hours ago. Pt states pain came on suddenly and has progressively gotten worse. Arrives A+O, VSS, NADN. [facility] and [facility] come here. Pt has a hx of diverticulitis, endorses nausea.

## 2023-01-17 NOTE — ED Provider Notes (Signed)
[facility] Provider Note  CSN: [id number] Arrival date & time: 01/17/23 1914  Chief Complaint(s) Abdominal Pain  HPI Dylan Mccall is a 52 y.o. male with past medical history as below, significant for diverticulitis, thyroid disease who presents to the ED with complaint of abdominal pain.  Pt reports around 5:30 PM to see me he had sudden onset of left lower quadrant sharp abdominal pain, radiation to his left flank.  He attempted to stand up and felt lightheaded, pain was worsening.  Went to PCP initially who directed him to come to the ER for further evaluation.  Patient has nausea without vomiting.  Chronically constipated but no BRBPR or melena.  No fevers or chills.  No recent medication or diet changes.  No chest pain or dyspnea.  No suspicious p.o. intake, no recent travel or sick contacts.  Follows with Dr. Therisa Doyne [facility], [facility] as well  Past Medical History Past Medical History:  Diagnosis Date   Diverticulitis    Thyroid disease    There are no problems to display for this patient.  Home Medication(s) Prior to Admission medications   Medication Sig Start Date End Date Taking? Authorizing Provider  Cetirizine HCl 10 MG CAPS Take 1 capsule by mouth daily as needed (for allergies).    [provider]  fluticasone (FLONASE) 50 MCG/ACT nasal spray Place 2 sprays into both nostrils daily. 08/13/14   Palumbo, April, MD  ibuprofen (ADVIL,MOTRIN) 200 MG tablet Take 400 mg by mouth every 6 (six) hours as needed for moderate pain.    [provider]  lansoprazole (PREVACID) 30 MG capsule Take 30 mg by mouth daily. 11/16/21   [provider]  linaclotide (LINZESS) 72 MCG capsule TAKE 1 CAPSULE BY MOUTH AT  LEAST 30 MINUTES BEFORE  FIRST MEAL OF THE DAY ON AN EMPTY STOMACH ONCE DAILY    [provider]  meloxicam (MOBIC) 15 MG tablet Take 1 tablet (15 mg total) by mouth daily. 02/07/21   Edrick Kins, DPM  omeprazole  (PRILOSEC) 40 MG capsule  06/28/19   [provider]  SYNTHROID 175 MCG tablet 1/2 TABLET ON AN EMPTY STOMACH IN THE MORNING MONDAY, WED, FRIDAY, 1 TABLET ALL OTHER DAYS 05/04/18   [provider]                                                                                                                                    Past Surgical History History reviewed. No pertinent surgical history. Family History Family History  Problem Relation Age of Onset   Diabetes type II Mother    Heart disease Father    Rheum arthritis Sister     Social History Social History   Tobacco Use   Smoking status: Never   Smokeless tobacco: Never  Substance Use Topics   Alcohol use: No   Drug use: No   Allergies Erythromycin and  Sulfa antibiotics  Review of Systems Review of Systems  Constitutional:  Negative for chills and fever.  HENT:  Negative for facial swelling and trouble swallowing.   Eyes:  Negative for photophobia and visual disturbance.  Respiratory:  Negative for cough and shortness of breath.   Cardiovascular:  Negative for chest pain and palpitations.  Gastrointestinal:  Positive for abdominal pain and nausea. Negative for vomiting.  Endocrine: Negative for polydipsia and polyuria.  Genitourinary:  Negative for difficulty urinating and hematuria.  Musculoskeletal:  Negative for gait problem and joint swelling.  Skin:  Negative for pallor and rash.  Neurological:  Positive for light-headedness. Negative for syncope and headaches.  Psychiatric/Behavioral:  Negative for agitation and confusion.     Physical Exam Vital Signs  I have reviewed the triage vital signs BP 136/79   Pulse 82   Temp 98.4 F (36.9 C)   Resp 18   Ht '5\' 10"'$  (1.778 m)   Wt 103.4 kg   SpO2 97%   BMI 32.71 kg/m  Physical Exam Vitals and nursing note reviewed.  Constitutional:      General: He is not in acute distress.    Appearance: Normal appearance. He is well-developed. He is not  ill-appearing.  HENT:     Head: Normocephalic and atraumatic.     Right Ear: External ear normal.     Left Ear: External ear normal.     Mouth/Throat:     Mouth: Mucous membranes are moist.  Eyes:     General: No scleral icterus. Cardiovascular:     Rate and Rhythm: Normal rate and regular rhythm.     Pulses: Normal pulses.     Heart sounds: Normal heart sounds.  Pulmonary:     Effort: Pulmonary effort is normal. No respiratory distress.     Breath sounds: Normal breath sounds.  Abdominal:     General: Abdomen is flat.     Palpations: Abdomen is soft.     Tenderness: There is abdominal tenderness in the left lower quadrant. There is no guarding or rebound.    Musculoskeletal:     Cervical back: No rigidity.     Right lower leg: No edema.     Left lower leg: No edema.  Skin:    General: Skin is warm and dry.     Capillary Refill: Capillary refill takes less than 2 seconds.  Neurological:     Mental Status: He is alert and oriented to person, place, and time.     GCS: GCS eye subscore is 4. GCS verbal subscore is 5. GCS motor subscore is 6.  Psychiatric:        Mood and Affect: Mood normal.        Behavior: Behavior normal.     ED Results and Treatments Labs (all labs ordered are listed, but only abnormal results are displayed) Labs Reviewed  CBC WITH DIFFERENTIAL/PLATELET - Abnormal; Notable for the following components:      Result Value   WBC 14.8 (*)    Neutro Abs 11.4 (*)    All other components within normal limits  COMPREHENSIVE METABOLIC PANEL  LIPASE, BLOOD  URINALYSIS, ROUTINE W REFLEX MICROSCOPIC  LACTIC ACID, PLASMA  LACTIC ACID, PLASMA  Radiology CT ABDOMEN PELVIS W CONTRAST  Result Date: 01/17/2023 CLINICAL DATA:  Left lower quadrant abdominal pain beginning a couple hours ago. EXAM: CT ABDOMEN AND PELVIS WITH CONTRAST TECHNIQUE:  Multidetector CT imaging of the abdomen and pelvis was performed using the standard protocol following bolus administration of intravenous contrast. RADIATION DOSE REDUCTION: This exam was performed according to the departmental dose-optimization program which includes automated exposure control, adjustment of the mA and/or kV according to patient size and/or use of iterative reconstruction technique. CONTRAST:  57m OMNIPAQUE IOHEXOL 350 MG/ML SOLN COMPARISON:  CT 04/22/2021 FINDINGS: Lower chest: No acute abnormality. Hepatobiliary: No focal liver abnormality is seen. No gallstones, gallbladder wall thickening, or biliary dilatation. Pancreas: Unremarkable. No pancreatic ductal dilatation or surrounding inflammatory changes. Spleen: Normal in size without focal abnormality. Adrenals/Urinary Tract: Normal adrenal glands. No urinary calculi or hydronephrosis. Unremarkable bladder. Stomach/Bowel: Sigmoid diverticulosis with mild wall thickening and adjacent pericolonic stranding about the proximal sigmoid colon in the left lower quadrant compatible with acute diverticulitis. Locules of free air throughout the anterior peritoneum compatible with perforation. No fluid collection or abscess. Moderate colonic stool load. Normal appendix. Unremarkable stomach. Vascular/Lymphatic: No significant vascular findings are present. No enlarged abdominal or pelvic lymph nodes. Reproductive: Unremarkable. Other: No abdominal wall hernia. Musculoskeletal: No acute osseous abnormality. IMPRESSION: Acute sigmoid diverticulitis with perforation. No fluid collection or abscess. Critical Value/emergent results were called by telephone at the time of interpretation on 01/17/2023 at 10:10 pm to provider SWynona Dove, who verbally acknowledged these results. Electronically Signed   By: TPlacido SouM.D.   On: 01/17/2023 22:19    Pertinent labs & imaging results that were available during my care of the patient were reviewed by me and  considered in my medical decision making (see MDM for details).  Medications Ordered in ED Medications  piperacillin-tazobactam (ZOSYN) IVPB 3.375 g (3.375 g Intravenous New Bag/Given 01/17/23 2301)  piperacillin-tazobactam (ZOSYN) IVPB 3.375 g (has no administration in time range)  ketorolac (TORADOL) 15 MG/ML injection 15 mg (has no administration in time range)  ondansetron (ZOFRAN) injection 4 mg (has no administration in time range)  iohexol (OMNIPAQUE) 350 MG/ML injection 100 mL (80 mLs Intravenous Contrast Given 01/17/23 2138)  sodium chloride 0.9 % bolus 1,000 mL (1,000 mLs Intravenous New Bag/Given 01/17/23 2300)                                                                                                                                     Procedures .Critical Care  Performed by: GJeanell Sparrow DO Authorized by: GJeanell Sparrow DO   Critical care provider statement:    Critical care time (minutes):  30   Critical care time was exclusive of:  Separately billable procedures and treating other patients   Critical care was necessary to treat or prevent imminent or life-threatening deterioration of the following conditions: perforated viscus.   Critical care was time  spent personally by me on the following activities:  Development of treatment plan with patient or surrogate, discussions with consultants, evaluation of patient's response to treatment, examination of patient, ordering and review of laboratory studies, ordering and review of radiographic studies, ordering and performing treatments and interventions, pulse oximetry, re-evaluation of patient's condition, review of old charts and obtaining history from patient or surrogate   Care discussed with: admitting provider     (including critical care time)  Medical Decision Making / ED Course    Medical Decision Making:    Ambros Hafele is a 52 y.o. male with past medical history as below, significant for diverticulitis,  thyroid disease who presents to the ED with complaint of abdominal pain.. The complaint involves an extensive differential diagnosis and also carries with it a high risk of complications and morbidity.  Serious etiology was considered. Ddx includes but is not limited to: Differential diagnosis includes but is not exclusive to acute appendicitis, renal colic, testicular torsion, urinary tract infection, prostatitis,  diverticulitis, small bowel obstruction, colitis, abdominal aortic aneurysm, gastroenteritis, constipation etc.   Complete initial physical exam performed, notably the patient  was NAD, abd not peritoneal .    Reviewed and confirmed nursing documentation for past medical history, family history, social history.  Vital signs reviewed.    Clinical Course as of 01/17/23 2311  Wed Jan 17, 2023  2209 Received call from radiology, pt with perforated diverticulitis. Will order zosyn, have charge nurse bring pt back to treatment area.  [SG]  2222 CTAP: "IMPRESSION: Acute sigmoid diverticulitis with perforation. No fluid collection or abscess." [SG]  2304 Discussed with general surgery on-call, agreed  w/ antibiotics, will see in consult.  Admit to medicine. [SG]    Clinical Course User Index [SG] Jeanell Sparrow, DO   Patient with complicated diverticulitis with perforation, free air on CT.  Abdomen is soft, nonperitoneal.  HDS.  Started on Zosyn.  Discussed with general surgery.  Will admit to medicine.    Additional history obtained: -Additional history obtained from na -External records from outside source obtained and reviewed including: Chart review including previous notes, labs, imaging, consultation notes including primary care documentation, medications, prior labs and imaging   Lab Tests: -I ordered, reviewed, and interpreted labs.   The pertinent results include:   Labs Reviewed  CBC WITH DIFFERENTIAL/PLATELET - Abnormal; Notable for the following components:      Result  Value   WBC 14.8 (*)    Neutro Abs 11.4 (*)    All other components within normal limits  COMPREHENSIVE METABOLIC PANEL  LIPASE, BLOOD  URINALYSIS, ROUTINE W REFLEX MICROSCOPIC  LACTIC ACID, PLASMA  LACTIC ACID, PLASMA    Notable for leukocytosis  EKG   EKG Interpretation  Date/Time:    Ventricular Rate:    PR Interval:    QRS Duration:   QT Interval:    QTC Calculation:   R Axis:     Text Interpretation:           Imaging Studies ordered: I ordered imaging studies including CT AP I independently visualized the following imaging with scope of interpretation limited to determining acute life threatening conditions related to emergency care: Perforated diverticulum I independently visualized and interpreted imaging. I agree with the radiologist interpretation   Medicines ordered and prescription drug management: Meds ordered this encounter  Medications   iohexol (OMNIPAQUE) 350 MG/ML injection 100 mL   piperacillin-tazobactam (ZOSYN) IVPB 3.375 g   sodium chloride 0.9 % bolus 1,000  mL   piperacillin-tazobactam (ZOSYN) IVPB 3.375 g    Order Specific Question:   Antibiotic Indication:    Answer:   Intra-abdominal Infection   ketorolac (TORADOL) 15 MG/ML injection 15 mg   ondansetron (ZOFRAN) injection 4 mg    -I have reviewed the patients home medicines and have made adjustments as needed   Consultations Obtained: I requested consultation with the Dr Rosendo Gros gen surg,  and discussed lab and imaging findings as well as pertinent plan - they recommend: medical admit, will see on consult [facility] or [facility]   Cardiac Monitoring: The patient was maintained on a cardiac monitor.  I personally viewed and interpreted the cardiac monitored which showed an underlying rhythm of: NSR  Social Determinants of Health:  Diagnosis or treatment significantly limited by social determinants of health: na   Reevaluation: After the interventions noted above, I reevaluated the patient and  found that they have improved  Co morbidities that complicate the patient evaluation  Past Medical History:  Diagnosis Date   Diverticulitis    Thyroid disease       Dispostion: Disposition decision including need for hospitalization was considered, and patient admitted to the hospital.    Final Clinical Impression(s) / ED Diagnoses Final diagnoses:  Diverticulitis of intestine with perforation, unspecified bleeding status, unspecified part of intestinal tract     This chart was dictated using voice recognition software.  Despite best efforts to proofread,  errors can occur which can change the documentation meaning.

## 2023-01-17 NOTE — ED Notes (Signed)
Attempted IV x 2, patient started having vagal response and this RN unsuccessful.

## 2023-01-18 DIAGNOSIS — N3289 Other specified disorders of bladder: Secondary | ICD-10-CM | POA: Diagnosis present

## 2023-01-18 DIAGNOSIS — Z833 Family history of diabetes mellitus: Secondary | ICD-10-CM | POA: Diagnosis not present

## 2023-01-18 DIAGNOSIS — K219 Gastro-esophageal reflux disease without esophagitis: Secondary | ICD-10-CM

## 2023-01-18 DIAGNOSIS — E039 Hypothyroidism, unspecified: Secondary | ICD-10-CM | POA: Diagnosis present

## 2023-01-18 DIAGNOSIS — K5900 Constipation, unspecified: Secondary | ICD-10-CM | POA: Diagnosis present

## 2023-01-18 DIAGNOSIS — Z6832 Body mass index (BMI) 32.0-32.9, adult: Secondary | ICD-10-CM | POA: Diagnosis not present

## 2023-01-18 DIAGNOSIS — Z791 Long term (current) use of non-steroidal anti-inflammatories (NSAID): Secondary | ICD-10-CM | POA: Diagnosis not present

## 2023-01-18 DIAGNOSIS — K5720 Diverticulitis of large intestine with perforation and abscess without bleeding: Secondary | ICD-10-CM | POA: Diagnosis present

## 2023-01-18 DIAGNOSIS — R109 Unspecified abdominal pain: Secondary | ICD-10-CM | POA: Diagnosis present

## 2023-01-18 DIAGNOSIS — K5909 Other constipation: Secondary | ICD-10-CM

## 2023-01-18 DIAGNOSIS — E669 Obesity, unspecified: Secondary | ICD-10-CM | POA: Diagnosis present

## 2023-01-18 DIAGNOSIS — Z8249 Family history of ischemic heart disease and other diseases of the circulatory system: Secondary | ICD-10-CM | POA: Diagnosis not present

## 2023-01-18 DIAGNOSIS — D72829 Elevated white blood cell count, unspecified: Secondary | ICD-10-CM

## 2023-01-18 DIAGNOSIS — Z79890 Hormone replacement therapy (postmenopausal): Secondary | ICD-10-CM | POA: Diagnosis not present

## 2023-01-18 DIAGNOSIS — Z79899 Other long term (current) drug therapy: Secondary | ICD-10-CM | POA: Diagnosis not present

## 2023-01-18 DIAGNOSIS — K5792 Diverticulitis of intestine, part unspecified, without perforation or abscess without bleeding: Secondary | ICD-10-CM

## 2023-01-18 LAB — T4, FREE: Free T4: 0.9 ng/dL (ref 0.61–1.12)

## 2023-01-18 LAB — LACTIC ACID, PLASMA: Lactic Acid, Venous: 0.8 mmol/L (ref 0.5–1.9)

## 2023-01-18 LAB — MAGNESIUM: Magnesium: 2.1 mg/dL (ref 1.7–2.4)

## 2023-01-18 LAB — TSH: TSH: 0.203 u[IU]/mL — ABNORMAL LOW (ref 0.350–4.500)

## 2023-01-18 LAB — HIV ANTIBODY (ROUTINE TESTING W REFLEX): HIV Screen 4th Generation wRfx: NONREACTIVE

## 2023-01-18 MED ORDER — ACETAMINOPHEN 325 MG PO TABS
650.0000 mg | ORAL_TABLET | Freq: Four times a day (QID) | ORAL | Status: DC | PRN
  Administered 2023-01-21: 650 mg via ORAL
  Filled 2023-01-18: qty 2

## 2023-01-18 MED ORDER — HYDROCODONE-ACETAMINOPHEN 5-325 MG PO TABS
1.0000 | ORAL_TABLET | Freq: Once | ORAL | Status: AC
  Administered 2023-01-18: 1 via ORAL
  Filled 2023-01-18: qty 1

## 2023-01-18 MED ORDER — ACETAMINOPHEN 500 MG PO TABS
1000.0000 mg | ORAL_TABLET | Freq: Once | ORAL | Status: AC
  Administered 2023-01-18: 1000 mg via ORAL
  Filled 2023-01-18: qty 2

## 2023-01-18 MED ORDER — ONDANSETRON HCL 4 MG/2ML IJ SOLN: 4.0000 mg | Freq: Four times a day (QID) | INTRAMUSCULAR | Status: DC | PRN

## 2023-01-18 MED ORDER — SODIUM CHLORIDE 0.9 % IV SOLN: INTRAVENOUS | Status: DC

## 2023-01-18 MED ORDER — LEVOTHYROXINE SODIUM 25 MCG PO TABS
175.0000 ug | ORAL_TABLET | Freq: Once | ORAL | Status: AC
  Administered 2023-01-18: 175 ug via ORAL
  Filled 2023-01-18: qty 3

## 2023-01-18 MED ORDER — MORPHINE SULFATE (PF) 2 MG/ML IV SOLN
2.0000 mg | INTRAVENOUS | Status: DC | PRN
  Administered 2023-01-18: 2 mg via INTRAVENOUS
  Filled 2023-01-18: qty 1

## 2023-01-18 MED ORDER — ONDANSETRON HCL 4 MG PO TABS: 4.0000 mg | ORAL_TABLET | Freq: Four times a day (QID) | ORAL | Status: DC | PRN

## 2023-01-18 MED ORDER — ACETAMINOPHEN 650 MG RE SUPP: 650.0000 mg | Freq: Four times a day (QID) | RECTAL | Status: DC | PRN

## 2023-01-18 MED ORDER — HYDROCODONE-ACETAMINOPHEN 5-325 MG PO TABS
1.0000 | ORAL_TABLET | ORAL | Status: DC | PRN
  Administered 2023-01-18: 1 via ORAL
  Administered 2023-01-19: 2 via ORAL
  Administered 2023-01-19 – 2023-01-21 (×5): 1 via ORAL
  Filled 2023-01-18 (×6): qty 1
  Filled 2023-01-18: qty 2

## 2023-01-18 NOTE — Assessment & Plan Note (Signed)
Continue PPI ?

## 2023-01-18 NOTE — Consult Note (Signed)
[facility] Surgery Consult Note  Dylan Mccall 02-05-1971  VO:[id number].    Requesting MD: Wynona Dove Chief Complaint/Reason for Consult: diverticulitis   HPI:  Dylan Mccall is a 52 y.o. male PMH hypothyroidism, chronic constipation on linzess PRN, and GERD who presented to the ED last night complaining of acute onset LLQ abdominal pain. He reports a prior h/o diverticulitis and is followed by Dr. Therisa Doyne. Last colonoscopy was about 2 years ago and showed diverticulosis. States that over the last year he has had about 4-5 episodes all managed outpatient with oral antibiotics and diet modification. This episode began yesterday. States that he had some mild abdominal pain in the morning, then yesterday evening the pain became acutely worse. It is LLQ and radiates into his left flank. Associated with nausea and chills, no emesis. Denies fever, diarrhea, constipation, blood in stool, or dysuria. States that this episode was much more severe than any other episode he has experienced in the past. In the ED patient was found to be hemodynamically stable. WBC 14.8, lactic acid 1.5 >> 0.8. CT abdomen/pelvis revealed acute sigmoid diverticulitis with locules of free air consistent with perforation; no fluid collection or abscess. Patient was started on IV zosyn and admitted to the medical service. General surgery also asked to see.  Abdominal surgical history: none Anticoagulants: none Nonsmoker Drinks alcohol occasionally  Denies illicit drug use   Family History  Problem Relation Age of Onset   Diabetes type II Mother    Heart disease Father    Rheum arthritis Sister     Past Medical History:  Diagnosis Date   Diverticulitis    Thyroid disease     History reviewed. No pertinent surgical history.  Social History:  reports that he has never smoked. He has never used smokeless tobacco. He reports that he does not drink alcohol and does not use drugs.  Allergies:  Allergies   Allergen Reactions   Erythromycin Nausea And Vomiting   Sulfa Antibiotics     (Not in a hospital admission)   Prior to Admission medications   Medication Sig Start Date End Date Taking? Authorizing Provider  acetaminophen (TYLENOL) 500 MG tablet Take 500 mg by mouth every 6 (six) hours as needed.   Yes [provider]  Cetirizine HCl 10 MG CAPS Take 1 capsule by mouth daily as needed (for allergies).   Yes [provider]  fluticasone (FLONASE) 50 MCG/ACT nasal spray Place 2 sprays into both nostrils daily. 08/13/14  Yes Palumbo, April, MD  ibuprofen (ADVIL,MOTRIN) 200 MG tablet Take 400 mg by mouth every 6 (six) hours as needed for moderate pain.   Yes [provider]  Iron-Vitamin C (VITRON-C PO) Take 1 tablet by mouth daily.   Yes [provider]  lansoprazole (PREVACID) 30 MG capsule Take 30 mg by mouth daily. 11/16/21  Yes [provider]  linaclotide (LINZESS) 72 MCG capsule TAKE 1 CAPSULE BY MOUTH AT  LEAST 30 MINUTES BEFORE  FIRST MEAL OF THE DAY ON AN EMPTY STOMACH ONCE DAILY   Yes [provider]  liothyronine (CYTOMEL) 5 MCG tablet Take 10 mcg by mouth daily. 10/09/22  Yes [provider]  MAGNESIUM GLUCONATE PO Take 2 tablets by mouth daily.   Yes [provider]  Multiple Vitamin (MULTIVITAMIN PO) Take 1 tablet by mouth daily.   Yes [provider]  Omega-3 Fatty Acids (FISH OIL PO) Take 2 tablets by mouth daily.   Yes [provider]  Red Yeast Rice  Extract (RED YEAST RICE PO) Take 2 tablets by mouth daily.   Yes [provider]  SYNTHROID 175 MCG tablet 1/2 TABLET ON AN EMPTY STOMACH IN THE MORNING MONDAY, WED, FRIDAY, 1 TABLET ALL OTHER DAYS 05/04/18  Yes [provider]  VITAMIN D PO Take 1 tablet by mouth daily.   Yes [provider]  meloxicam (MOBIC) 15 MG tablet Take 1 tablet (15 mg total) by mouth daily. Patient not taking: Reported on 01/18/2023 02/07/21    Edrick Kins, DPM    Blood pressure (!) 81/49, pulse 63, temperature 98.3 F (36.8 C), temperature source Oral, resp. rate 16, height '5\' 10"'$  (1.778 m), weight 103.4 kg, SpO2 96 %. Physical Exam: General: pleasant, WD/WN male who is laying in bed in NAD HEENT: head is normocephalic, atraumatic.  Sclera are noninjected.  Pupils equal and round.  Ears and nose without any masses or lesions.  Mouth is pink and moist. Dentition fair Heart: regular, rate, and rhythm Lungs: CTAB, no wheezes, rhonchi, or rales noted.  Respiratory effort nonlabored on room air Abd: soft, ND, +BS, no masses, hernias, or organomegaly. Moderate LLQ TTP without diffuse peritonitis, no rebound or guarding MS: no BUE/BLE edema, calves soft and nontender Skin: warm and dry with no masses, lesions, or rashes Psych: A&Ox4 with an appropriate affect Neuro: MAEs, no gross motor or sensory deficits BUE/BLE  Results for orders placed or performed during the hospital encounter of 01/17/23 (from the past 48 hour(s))  Urinalysis, Routine w reflex microscopic -Urine, Clean Catch     Status: None   Collection Time: 01/17/23  7:49 PM  Result Value Ref Range   Color, Urine YELLOW YELLOW   APPearance CLEAR CLEAR   Specific Gravity, Urine 1.017 1.005 - 1.030   pH 6.0 5.0 - 8.0   Glucose, UA NEGATIVE NEGATIVE mg/dL   Hgb urine dipstick NEGATIVE NEGATIVE   Bilirubin Urine NEGATIVE NEGATIVE   Ketones, ur NEGATIVE NEGATIVE mg/dL   Protein, ur NEGATIVE NEGATIVE mg/dL   Nitrite NEGATIVE NEGATIVE   Leukocytes,Ua NEGATIVE NEGATIVE    Comment: Performed at [facility], [place], [place] [address]  CBC with Differential     Status: Abnormal   Collection Time: 01/17/23  8:41 PM  Result Value Ref Range   WBC 14.8 (H) 4.0 - 10.5 K/uL   RBC 5.44 4.22 - 5.81 MIL/uL   Hemoglobin 16.0 13.0 - 17.0 g/dL   HCT 49.0 39.0 - 52.0 %   MCV 90.1 80.0 - 100.0 fL   MCH 29.4 26.0 - 34.0 pg   MCHC 32.7 30.0 - 36.0  g/dL   RDW 13.5 11.5 - 15.5 %   Platelets 256 150 - 400 K/uL   nRBC 0.0 0.0 - 0.2 %   Neutrophils Relative % 78 %   Neutro Abs 11.4 (H) 1.7 - 7.7 K/uL   Lymphocytes Relative 15 %   Lymphs Abs 2.2 0.7 - 4.0 K/uL   Monocytes Relative 6 %   Monocytes Absolute 0.9 0.1 - 1.0 K/uL   Eosinophils Relative 1 %   Eosinophils Absolute 0.2 0.0 - 0.5 K/uL   Basophils Relative 0 %   Basophils Absolute 0.1 0.0 - 0.1 K/uL   Immature Granulocytes 0 %   Abs Immature Granulocytes 0.05 0.00 - 0.07 K/uL    Comment: Performed at [facility], [address], [place], [place] [address]  Comprehensive metabolic panel     Status: None   Collection Time: 01/17/23  8:41 PM  Result Value Ref Range   Sodium 137 135 - 145 mmol/L   Potassium 4.2 3.5 - 5.1 mmol/L   Chloride 104 98 - 111 mmol/L   CO2 26 22 - 32 mmol/L   Glucose, Bld 92 70 - 99 mg/dL    Comment: Glucose reference range applies only to samples taken after fasting for at least 8 hours.   BUN 19 6 - 20 mg/dL   Creatinine, Ser 0.92 0.61 - 1.24 mg/dL   Calcium 9.5 8.9 - 10.3 mg/dL   Total Protein 7.4 6.5 - 8.1 g/dL   Albumin 4.4 3.5 - 5.0 g/dL   AST 27 15 - 41 U/L   ALT 19 0 - 44 U/L   Alkaline Phosphatase 63 38 - 126 U/L   Total Bilirubin 0.6 0.3 - 1.2 mg/dL   GFR, Estimated >60 >60 mL/min    Comment: (NOTE) Calculated using the CKD-EPI Creatinine Equation (2021)    Anion gap 7 5 - 15    Comment: Performed at [facility], [place], [place] [address]  Lipase, blood     Status: None   Collection Time: 01/17/23  8:41 PM  Result Value Ref Range   Lipase 29 11 - 51 U/L    Comment: Performed at [facility], [address], [place], [place] [address]  Lactic acid, plasma     Status: None   Collection Time: 01/17/23 11:01 PM  Result Value Ref Range   Lactic Acid, Venous 1.5 0.5 - 1.9 mmol/L    Comment: Performed at [facility], [address],  [place], [place] [address]  Lactic acid, plasma     Status: None   Collection Time: 01/18/23  6:37 AM  Result Value Ref Range   Lactic Acid, Venous 0.8 0.5 - 1.9 mmol/L    Comment: Performed at [facility], [address], [place], [place] [address]   CT ABDOMEN PELVIS W CONTRAST  Result Date: 01/17/2023 CLINICAL DATA:  Left lower quadrant abdominal pain beginning a couple hours ago. EXAM: CT ABDOMEN AND PELVIS WITH CONTRAST TECHNIQUE: Multidetector CT imaging of the abdomen and pelvis was performed using the standard protocol following bolus administration of intravenous contrast. RADIATION DOSE REDUCTION: This exam was performed according to the departmental dose-optimization program which includes automated exposure control, adjustment of the mA and/or kV according to patient size and/or use of iterative reconstruction technique. CONTRAST:  36m OMNIPAQUE IOHEXOL 350 MG/ML SOLN COMPARISON:  CT 04/22/2021 FINDINGS: Lower chest: No acute abnormality. Hepatobiliary: No focal liver abnormality is seen. No gallstones, gallbladder wall thickening, or biliary dilatation. Pancreas: Unremarkable. No pancreatic ductal dilatation or surrounding inflammatory changes. Spleen: Normal in size without focal abnormality. Adrenals/Urinary Tract: Normal adrenal glands. No urinary calculi or hydronephrosis. Unremarkable bladder. Stomach/Bowel: Sigmoid diverticulosis with mild wall thickening and adjacent pericolonic stranding about the proximal sigmoid colon in the left lower quadrant compatible with acute diverticulitis. Locules of free air throughout the anterior peritoneum compatible with perforation. No fluid collection or abscess. Moderate colonic stool load. Normal appendix. Unremarkable stomach. Vascular/Lymphatic: No significant vascular findings are present. No enlarged abdominal or pelvic lymph nodes. Reproductive: Unremarkable. Other: No abdominal wall hernia. Musculoskeletal: No acute osseous  abnormality. IMPRESSION: Acute sigmoid diverticulitis with perforation. No fluid collection or abscess. Critical Value/emergent results were called by telephone at the time of interpretation on 01/17/2023 at 10:10 pm to provider SWynona Dove, who verbally acknowledged these results. Electronically Signed   By: TDorothea Ogle  Stutzman M.D.   On: 01/17/2023 22:19    Anti-infectives (From admission, onward)    Start     Dose/Rate Route Frequency Ordered Stop   01/18/23 0600  piperacillin-tazobactam (ZOSYN) IVPB 3.375 g        3.375 g 12.5 mL/hr over 240 Minutes Intravenous Every 8 hours 01/17/23 2258     01/17/23 2215  piperacillin-tazobactam (ZOSYN) IVPB 3.375 g        3.375 g 12.5 mL/hr over 240 Minutes Intravenous  Once 01/17/23 2209 01/18/23 [id number]        Assessment/Plan Perforated sigmoid diverticulitis - last colonoscopy about 2 years ago with Dr. Therisa Doyne. This is his first episode requiring admission, but he has had about 4-5 episodes over the last year  - CT 3/13 shows acute sigmoid diverticulitis with locules of free air consistent with perforation; no fluid collection or abscess - No indication for acute surgical intervention. Agree with bowel rest and IV zosyn. Hopefully this episode will resolve with conservative management. Given that he has had so many episodes over the last year will likely plan for follow up with colorectal surgeon after discharge to discuss elective colectomy.  ID - zosyn VTE - SCDs, ok for chemical dvt ppx from surgical standpoint FEN - IVF, NPO Foley - none  Hypothyroidism GERD Chronic constipation  I reviewed ED provider notes, last 24 h vitals and pain scores, last 48 h intake and output, last 24 h labs and trends, and last 24 h imaging results.   Margie Billet, PA-C [facility] 01/18/2023, 1:42 PM Please see Amion for pager number during day hours 7:00am-4:30pm

## 2023-01-18 NOTE — Assessment & Plan Note (Signed)
Secondary to diverticulitis with perforation  No other criteria for SIRS/sepsis Lactic acid wnl Continue zosyn/IVF and trend BC if fever >100.4

## 2023-01-18 NOTE — H&P (Signed)
History and Physical    Patient: Dylan Mccall Dylan Mccall DOB: [birth year] DOA: 01/17/2023 DOS: the patient was seen and examined on 01/18/2023 PCP: Marda Stalker, PA-C  Patient coming from:  [place]  - lives with his wife and son.    Chief Complaint: abdominal pain   HPI: [name] is a 52 y.o. male with medical history significant of  chronic constipation, hypothyroidism, history of colon diverticulosis and diverticulitis, seen in the past by Dr. Therisa Doyne, who presented to [facility] ED with complaints of sudden onset sharp left lower quadrant abdominal pain, radiating to his left flank.  Associated with nausea without vomiting.  His pain became increasingly worse.  He went to his PCP initially who directed him to come to the ER for further evaluation. He states he was fine yesterday until in the evening his lower abdomen started to hurt and seemed to get progressively worse, worse in the LLQ.  Pain was constant, described as dull to sharp, rated as 6/10. Worse with movement. No radiation. He went to [place] walk in clinic yesterday evening and they suggested he come to ED. He denies any diarrhea or vomiting. Had some nausea yesterday evening with the pain. He has not eaten since yesterday.   He states he has had 4-5 flairs of diverticulitis since December. No  history of abdominal surgeries.   Denies any fever/chills, vision changes/headaches, chest pain or palpitations, shortness of breath or cough, dysuria or leg swelling.    He does not smoke or drink alcohol.   ER Course:  vitals: afebrile, bp: 131/91, HR: 86, RR: 18, oxygen: 100%RA Pertinent labs: wbc: 14.8, lactic acid wnl x2 CT abdomen/pelvis: Acute sigmoid diverticulitis with perforation. No fluid collection or abscess. In ED: general surgery and GI were consulted. Recommended IV antibiotics and admission by hospitalist and they will follow along. Received zosyn, toradol, zofran and IVF.    Review of Systems: As mentioned in the  history of present illness. All other systems reviewed and are negative. Past Medical History:  Diagnosis Date   Diverticulitis    Thyroid disease    History reviewed. No pertinent surgical history. Social History:  reports that he has never smoked. He has never used smokeless tobacco. He reports that he does not drink alcohol and does not use drugs.  Allergies  Allergen Reactions   Erythromycin Nausea And Vomiting   Sulfa Antibiotics     Family History  Problem Relation Age of Onset   Diabetes type II Mother    Heart disease Father    Rheum arthritis Sister     Prior to Admission medications   Medication Sig Start Date End Date Taking? Authorizing Provider  acetaminophen (TYLENOL) 500 MG tablet Take 500 mg by mouth every 6 (six) hours as needed.   Yes [provider]  Cetirizine HCl 10 MG CAPS Take 1 capsule by mouth daily as needed (for allergies).   Yes [provider]  fluticasone (FLONASE) 50 MCG/ACT nasal spray Place 2 sprays into both nostrils daily. 08/13/14  Yes Palumbo, April, MD  ibuprofen (ADVIL,MOTRIN) 200 MG tablet Take 400 mg by mouth every 6 (six) hours as needed for moderate pain.   Yes [provider]  Iron-Vitamin C (VITRON-C PO) Take 1 tablet by mouth daily.   Yes [provider]  lansoprazole (PREVACID) 30 MG capsule Take 30 mg by mouth daily. 11/16/21  Yes [provider]  linaclotide (LINZESS) 72 MCG capsule TAKE 1 CAPSULE BY MOUTH AT  LEAST 30 MINUTES BEFORE  FIRST MEAL OF THE DAY ON AN EMPTY STOMACH ONCE DAILY   Yes [provider]  liothyronine (CYTOMEL) 5 MCG tablet Take 10 mcg by mouth daily. 10/09/22  Yes [provider]  MAGNESIUM GLUCONATE PO Take 2 tablets by mouth daily.   Yes [provider]  Multiple Vitamin (MULTIVITAMIN PO) Take 1 tablet by mouth daily.   Yes [provider]  Omega-3 Fatty Acids (FISH OIL PO) Take 2 tablets by mouth daily.   Yes [provider]   Red Yeast Rice Extract (RED YEAST RICE PO) Take 2 tablets by mouth daily.   Yes [provider]  SYNTHROID 175 MCG tablet 1/2 TABLET ON AN EMPTY STOMACH IN THE MORNING MONDAY, WED, FRIDAY, 1 TABLET ALL OTHER DAYS 05/04/18  Yes [provider]  VITAMIN D PO Take 1 tablet by mouth daily.   Yes [provider]  meloxicam (MOBIC) 15 MG tablet Take 1 tablet (15 mg total) by mouth daily. Patient not taking: Reported on 01/18/2023 02/07/21   Edrick Kins, DPM    Physical Exam: Vitals:   01/18/23 1030 01/18/23 1400 01/18/23 1500 01/18/23 1600  BP: (!) 81/49 100/70 113/68 127/84  Pulse: 63 69 (!) 57 73  Resp: '16 18 15 16  '$ Temp:  97.6 F (36.4 C) 97.7 F (36.5 C) 97.7 F (36.5 C)  TempSrc:  Oral Oral Oral  SpO2: 96% 96%  97%  Weight:      Height:       General:  Appears calm and comfortable and is in NAD Eyes:  PERRL, EOMI, normal lids, iris ENT:  grossly normal hearing, lips & tongue, mmm; appropriate dentition Neck:  no LAD, masses or thyromegaly; no carotid bruits Cardiovascular:  RRR, no m/r/g. No LE edema.  Respiratory:   CTA bilaterally with no wheezes/rales/rhonchi.  Normal respiratory effort. Abdomen:  soft, TTP in lower quadrants, much > in LLQ. No rebound or guarding. , ND, NABS Back:   normal alignment, no CVAT Skin:  no rash or induration seen on limited exam Musculoskeletal:  grossly normal tone BUE/BLE, good ROM, no bony abnormality Lower extremity:  No LE edema.  Limited foot exam with no ulcerations.  2+ distal pulses. Psychiatric:  grossly normal mood and affect, speech fluent and appropriate, AOx3 Neurologic:  CN 2-12 grossly intact, moves all extremities in coordinated fashion, sensation intact   Radiological Exams on Admission: Independently reviewed - see discussion in A/P where applicable  CT ABDOMEN PELVIS W CONTRAST  Result Date: 01/17/2023 CLINICAL DATA:  Left lower quadrant abdominal pain beginning a couple hours ago. EXAM: CT  ABDOMEN AND PELVIS WITH CONTRAST TECHNIQUE: Multidetector CT imaging of the abdomen and pelvis was performed using the standard protocol following bolus administration of intravenous contrast. RADIATION DOSE REDUCTION: This exam was performed according to the departmental dose-optimization program which includes automated exposure control, adjustment of the mA and/or kV according to patient size and/or use of iterative reconstruction technique. CONTRAST:  41m OMNIPAQUE IOHEXOL 350 MG/ML SOLN COMPARISON:  CT 04/22/2021 FINDINGS: Lower chest: No acute abnormality. Hepatobiliary: No focal liver abnormality is seen. No gallstones, gallbladder wall thickening, or biliary dilatation. Pancreas: Unremarkable. No pancreatic ductal dilatation or surrounding inflammatory changes. Spleen: Normal in size without focal abnormality. Adrenals/Urinary Tract: Normal adrenal glands. No urinary calculi or hydronephrosis. Unremarkable bladder. Stomach/Bowel: Sigmoid diverticulosis with mild wall thickening and adjacent pericolonic stranding about the proximal sigmoid colon in the left lower quadrant compatible with acute diverticulitis. Locules of free air throughout the  anterior peritoneum compatible with perforation. No fluid collection or abscess. Moderate colonic stool load. Normal appendix. Unremarkable stomach. Vascular/Lymphatic: No significant vascular findings are present. No enlarged abdominal or pelvic lymph nodes. Reproductive: Unremarkable. Other: No abdominal wall hernia. Musculoskeletal: No acute osseous abnormality. IMPRESSION: Acute sigmoid diverticulitis with perforation. No fluid collection or abscess. Critical Value/emergent results were called by telephone at the time of interpretation on 01/17/2023 at 10:10 pm to provider Wynona Dove , who verbally acknowledged these results. Electronically Signed   By: Placido Sou M.D.   On: 01/17/2023 22:19      Labs on Admission: I have personally reviewed the available  labs and imaging studies at the time of the admission.  Pertinent labs:   wbc: 14.8,  lactic acid wnl x2  Assessment and Plan: Principal Problem:   Acute diverticulitis with perforation Active Problems:   Leukocytosis   Hypothyroidism   Chronic constipation   GERD (gastroesophageal reflux disease)    Assessment and Plan: * Acute diverticulitis with perforation 52 year old presenting with 1 day history of LLQ pain in setting of known diverticulosis found to have acute sigmoid diverticulitis with perforation. No collection or abscess.  -admit to inpatient -GI and general surgery consulted -plan for conservative therapy with bowel rest, IVF and abx -4-5 episodes of diverticulitis since 10/2022  -f/u outpatient with colorectal surgery for possible colectomy after this subsides and heals -lactic acid wnl  -NPO except for ice chips and sips with meds -pain medication PRN  -IVF   Leukocytosis Secondary to diverticulitis with perforation  No other criteria for SIRS/sepsis Lactic acid wnl Continue zosyn/IVF and trend BC if fever >100.4   Hypothyroidism Chest TSH/free T4 Continue synthroid/cytomel   Chronic constipation Hold medication in setting of perforation  He takes metamucil TID and stool softener 2 pills nightly.   GERD (gastroesophageal reflux disease) Continue PPI     Advance Care Planning:   Code Status: Full Code   Consults: GI/general surgery   DVT Prophylaxis: SCDs  Family Communication: none   Severity of Illness: The appropriate patient status for this patient is INPATIENT. Inpatient status is judged to be reasonable and necessary in order to provide the required intensity of service to ensure the patient's safety. The patient's presenting symptoms, physical exam findings, and initial radiographic and laboratory data in the context of their chronic comorbidities is felt to place them at high risk for further clinical deterioration. Furthermore, it is  not anticipated that the patient will be medically stable for discharge from the hospital within 2 midnights of admission.   * I certify that at the point of admission it is my clinical judgment that the patient will require inpatient hospital care spanning beyond 2 midnights from the point of admission due to high intensity of service, high risk for further deterioration and high frequency of surveillance required.*  Author: Orma Flaming, MD 01/18/2023 4:19 PM  For on call review [email or web address].

## 2023-01-18 NOTE — Progress Notes (Signed)
  Transition of Care [facility]) Screening Note   Patient Details  Name: Dylan Mccall Date of Birth: [birth year]   Transition of Care [facility]) CM/SW Contact:    Benard Halsted, LCSW Phone Number: 01/18/2023, 5:30 PM    Transition of Care Department [facility]) has reviewed patient from home and no TOC needs have been identified at this time. We will continue to monitor patient advancement through interdisciplinary progression rounds. If new patient transition needs arise, please place a TOC consult.

## 2023-01-18 NOTE — ED Notes (Signed)
[facility] called for transportation 

## 2023-01-18 NOTE — Assessment & Plan Note (Signed)
Chest TSH/free T4 Continue synthroid/cytomel

## 2023-01-18 NOTE — Assessment & Plan Note (Addendum)
52 year old presenting with 1 day history of LLQ pain in setting of known diverticulosis found to have acute sigmoid diverticulitis with perforation. No collection or abscess.  -admit to inpatient -GI and general surgery consulted -plan for conservative therapy with bowel rest, IVF and abx -4-5 episodes of diverticulitis since 10/2022  -f/u outpatient with colorectal surgery for possible colectomy after this subsides and heals -lactic acid wnl  -NPO except for ice chips and sips with meds -pain medication PRN  -IVF

## 2023-01-18 NOTE — Assessment & Plan Note (Addendum)
Hold medication in setting of perforation  He takes metamucil TID and stool softener 2 pills nightly.

## 2023-01-18 NOTE — ED Notes (Signed)
Lauren will send transport.Bed Ready at [facility] Room#16.-ABB(NS)

## 2023-01-19 DIAGNOSIS — K5909 Other constipation: Secondary | ICD-10-CM

## 2023-01-19 DIAGNOSIS — K219 Gastro-esophageal reflux disease without esophagitis: Secondary | ICD-10-CM

## 2023-01-19 DIAGNOSIS — E039 Hypothyroidism, unspecified: Secondary | ICD-10-CM

## 2023-01-19 LAB — CBC
HCT: 42.4 % (ref 39.0–52.0)
Hemoglobin: 14.3 g/dL (ref 13.0–17.0)
MCH: 30.3 pg (ref 26.0–34.0)
MCHC: 33.7 g/dL (ref 30.0–36.0)
MCV: 89.8 fL (ref 80.0–100.0)
Platelets: 221 10*3/uL (ref 150–400)
RBC: 4.72 MIL/uL (ref 4.22–5.81)
RDW: 13.2 % (ref 11.5–15.5)
WBC: 9.3 10*3/uL (ref 4.0–10.5)
nRBC: 0 % (ref 0.0–0.2)

## 2023-01-19 LAB — BASIC METABOLIC PANEL
Anion gap: 12 (ref 5–15)
BUN: 11 mg/dL (ref 6–20)
CO2: 22 mmol/L (ref 22–32)
Calcium: 8.7 mg/dL — ABNORMAL LOW (ref 8.9–10.3)
Chloride: 104 mmol/L (ref 98–111)
Creatinine, Ser: 0.96 mg/dL (ref 0.61–1.24)
GFR, Estimated: >60 mL/min (ref >60)
Glucose, Bld: 82 mg/dL (ref 70–99)
Potassium: 4 mmol/L (ref 3.5–5.1)
Sodium: 138 mmol/L (ref 135–145)

## 2023-01-19 MED ORDER — FLEET ENEMA 7-19 GM/118ML RE ENEM: 1.0000 | ENEMA | Freq: Every day | RECTAL | Status: DC | PRN

## 2023-01-19 MED ORDER — ENOXAPARIN SODIUM 40 MG/0.4ML IJ SOSY
40.0000 mg | PREFILLED_SYRINGE | INTRAMUSCULAR | Status: DC
  Administered 2023-01-19 – 2023-01-21 (×3): 40 mg via SUBCUTANEOUS
  Filled 2023-01-19 (×3): qty 0.4

## 2023-01-19 MED ORDER — DOCUSATE SODIUM 100 MG PO CAPS
100.0000 mg | ORAL_CAPSULE | Freq: Every day | ORAL | Status: DC | PRN
  Administered 2023-01-19: 100 mg via ORAL
  Filled 2023-01-19: qty 1

## 2023-01-19 MED ORDER — LEVOTHYROXINE SODIUM 75 MCG PO TABS: 150.0000 ug | ORAL_TABLET | Freq: Every day | ORAL | Status: DC

## 2023-01-19 MED ORDER — LEVOTHYROXINE SODIUM 75 MCG PO TABS
87.5000 ug | ORAL_TABLET | ORAL | Status: DC
  Administered 2023-01-19: 87.5 ug via ORAL
  Filled 2023-01-19: qty 1

## 2023-01-19 MED ORDER — BOOST / RESOURCE BREEZE PO LIQD CUSTOM
1.0000 | Freq: Two times a day (BID) | ORAL | Status: DC
  Administered 2023-01-19 – 2023-01-20 (×4): 1 via ORAL

## 2023-01-19 MED ORDER — LEVOTHYROXINE SODIUM 75 MCG PO TABS
175.0000 ug | ORAL_TABLET | ORAL | Status: DC
  Administered 2023-01-20 – 2023-01-21 (×2): 175 ug via ORAL
  Filled 2023-01-19 (×3): qty 1

## 2023-01-19 MED ORDER — LIOTHYRONINE SODIUM 5 MCG PO TABS
10.0000 ug | ORAL_TABLET | Freq: Every day | ORAL | Status: DC
  Administered 2023-01-19 – 2023-01-21 (×3): 10 ug via ORAL
  Filled 2023-01-19 (×3): qty 2

## 2023-01-19 MED ORDER — BISACODYL 10 MG RE SUPP: 10.0000 mg | Freq: Every day | RECTAL | Status: DC | PRN

## 2023-01-19 MED ORDER — PANTOPRAZOLE SODIUM 40 MG PO TBEC
40.0000 mg | DELAYED_RELEASE_TABLET | Freq: Every day | ORAL | Status: DC
  Administered 2023-01-19 – 2023-01-21 (×3): 40 mg via ORAL
  Filled 2023-01-19 (×3): qty 1

## 2023-01-19 NOTE — Progress Notes (Signed)
[facility] Progress Note     Subjective: CC-  Continues to have some LLQ abdominal pain but it is much less than yesterday. Denies any n/v. Passing flatus, no BM. WBC 9.3, afebrile.  Objective: Vital signs in last 24 hours: Temp:  [97.6 F (36.4 C)-98.5 F (36.9 C)] 98.2 F (36.8 C) (03/15 0337) Pulse Rate:  [57-84] 82 (03/15 1000) Resp:  [14-18] 14 (03/15 1000) BP: (93-131)/(55-88) 131/88 (03/15 1000) SpO2:  [93 %-97 %] 96 % (03/15 0834) Last BM Date : 01/17/23  Intake/Output from previous day: 03/14 0701 - 03/15 0700 In: 353.4 [I.V.:241.1; IV Piggyback:112.3] Out: -  Intake/Output this shift: No intake/output data recorded.  PE: Gen:  Alert, NAD, pleasant Abd: soft, ND, +BS, no masses, hernias, or organomegaly. Mild LLQ TTP with no rebound or guarding  Lab Results:  Recent Labs    01/17/23 2041 01/19/23 0803  WBC 14.8* 9.3  HGB 16.0 14.3  HCT 49.0 42.4  PLT 256 221   BMET Recent Labs    01/17/23 2041 01/19/23 0803  NA 137 138  K 4.2 4.0  CL 104 104  CO2 26 22  GLUCOSE 92 82  BUN 19 11  CREATININE 0.92 0.96  CALCIUM 9.5 8.7*   PT/INR No results for input(s): "LABPROT", "INR" in the last 72 hours. CMP     Component Value Date/Time   NA 138 01/19/2023 0803   K 4.0 01/19/2023 0803   CL 104 01/19/2023 0803   CO2 22 01/19/2023 0803   GLUCOSE 82 01/19/2023 0803   BUN 11 01/19/2023 0803   CREATININE 0.96 01/19/2023 0803   CALCIUM 8.7 (L) 01/19/2023 0803   PROT 7.4 01/17/2023 2041   ALBUMIN 4.4 01/17/2023 2041   AST 27 01/17/2023 2041   ALT 19 01/17/2023 2041   ALKPHOS 63 01/17/2023 2041   BILITOT 0.6 01/17/2023 2041   GFRNONAA >60 01/19/2023 0803   GFRAA >90 02/13/2015 1719   Lipase     Component Value Date/Time   LIPASE 29 01/17/2023 2041       Studies/Results: CT ABDOMEN PELVIS W CONTRAST  Result Date: 01/17/2023 CLINICAL DATA:  Left lower quadrant abdominal pain beginning a couple hours ago. EXAM: CT ABDOMEN AND  PELVIS WITH CONTRAST TECHNIQUE: Multidetector CT imaging of the abdomen and pelvis was performed using the standard protocol following bolus administration of intravenous contrast. RADIATION DOSE REDUCTION: This exam was performed according to the departmental dose-optimization program which includes automated exposure control, adjustment of the mA and/or kV according to patient size and/or use of iterative reconstruction technique. CONTRAST:  7mL OMNIPAQUE IOHEXOL 350 MG/ML SOLN COMPARISON:  CT 04/22/2021 FINDINGS: Lower chest: No acute abnormality. Hepatobiliary: No focal liver abnormality is seen. No gallstones, gallbladder wall thickening, or biliary dilatation. Pancreas: Unremarkable. No pancreatic ductal dilatation or surrounding inflammatory changes. Spleen: Normal in size without focal abnormality. Adrenals/Urinary Tract: Normal adrenal glands. No urinary calculi or hydronephrosis. Unremarkable bladder. Stomach/Bowel: Sigmoid diverticulosis with mild wall thickening and adjacent pericolonic stranding about the proximal sigmoid colon in the left lower quadrant compatible with acute diverticulitis. Locules of free air throughout the anterior peritoneum compatible with perforation. No fluid collection or abscess. Moderate colonic stool load. Normal appendix. Unremarkable stomach. Vascular/Lymphatic: No significant vascular findings are present. No enlarged abdominal or pelvic lymph nodes. Reproductive: Unremarkable. Other: No abdominal wall hernia. Musculoskeletal: No acute osseous abnormality. IMPRESSION: Acute sigmoid diverticulitis with perforation. No fluid collection or abscess. Critical Value/emergent results were called by telephone at the time of interpretation  on 01/17/2023 at 10:10 pm to provider Wynona Dove , who verbally acknowledged these results. Electronically Signed   By: Placido Sou M.D.   On: 01/17/2023 22:19    Anti-infectives: Anti-infectives (From admission, onward)    Start      Dose/Rate Route Frequency Ordered Stop   01/18/23 0600  piperacillin-tazobactam (ZOSYN) IVPB 3.375 g        3.375 g 12.5 mL/hr over 240 Minutes Intravenous Every 8 hours 01/17/23 2258     01/17/23 2215  piperacillin-tazobactam (ZOSYN) IVPB 3.375 g        3.375 g 12.5 mL/hr over 240 Minutes Intravenous  Once 01/17/23 2209 01/18/23 [id number]        Assessment/Plan Perforated sigmoid diverticulitis - last colonoscopy about 2 years ago with Dr. Therisa Doyne. This is his first episode requiring admission, but he has had about 4-5 episodes over the last year  - CT 3/13 shows acute sigmoid diverticulitis with locules of free air consistent with perforation; no fluid collection or abscess - WBC normalized, afebrile, and pain improving. [place] for clear liquids today. Continue IV zosyn. Hopefully this episode will resolve with conservative management. Given that he has had so many episodes over the last year recommend follow up with gastroenterologist (Dr. Therisa Doyne) for possible repeat colonoscopy and then with colorectal surgeon to discuss elective colectomy.   ID - zosyn VTE - SCDs, lovenox FEN - IVF, CLD Foley - none   Hypothyroidism GERD Chronic constipation  I reviewed last 24 h vitals and pain scores, last 48 h intake and output, and last 24 h labs and trends.    LOS: 1 day    Wellington Hampshire, [facility] 01/19/2023, 10:49 AM Please see Amion for pager number during day hours 7:00am-4:30pm

## 2023-01-19 NOTE — Progress Notes (Signed)
PROGRESS NOTE        PATIENT DETAILS Name: Dylan Mccall Age: 52 y.o. Sex: male Date of Birth: December 14, 1970 Admit Date: 01/17/2023 Admitting Physician Kayleen Memos, DO IO:[id number], [place], [place]  Brief Summary: Patient is a 52 y.o.  male with prior history of diverticulitis involving sigmoid colon, hypothyroidism, constipation-presented with abdominal pain x 1 day-found to have acute diverticulitis with perforation.  Significant events: 3/13>> admit to [facility].  Significant studies: 3/13>> CT abdomen/pelvis: Acute sigmoid diverticulitis with perforation.  No abscess.  Significant microbiology data: None  Procedures: None  Consults: CCS  Subjective: Lying comfortably in bed-denies any chest pain or shortness of breath.  Acknowledges improvement in lower abdominal pain.  Objective: Vitals: Blood pressure (!) 93/56, pulse 67, temperature 98.2 F (36.8 C), temperature source Oral, resp. rate 16, height 5\' 10"  (1.778 m), weight 103.4 kg, SpO2 96 %.   Exam: Gen Exam:Alert awake-not in any distress HEENT:atraumatic, normocephalic Chest: B/L clear to auscultation anteriorly CVS:S1S2 regular Abdomen: Soft-hardly any tenderness in the LLQ today. Extremities:no edema Neurology: Non focal Skin: no rash  Pertinent Labs/Radiology:    Latest Ref Rng & Units 01/17/2023    8:41 PM 02/13/2015    5:19 PM 02/05/2008    3:41 PM  CBC  WBC 4.0 - 10.5 K/uL 14.8  10.2  7.9   Hemoglobin 13.0 - 17.0 g/dL 16.0  14.9  15.2   Hematocrit 39.0 - 52.0 % 49.0  44.5  43.0   Platelets 150 - 400 K/uL 256  300  290     Lab Results  Component Value Date   NA 137 01/17/2023   K 4.2 01/17/2023   CL 104 01/17/2023   CO2 26 01/17/2023      Assessment/Plan: Acute sigmoid diverticulitis with perforation Clinically improved Continue IV antibiotics General surgery following and directing care.  Hypothyroidism TSH suppressed but normal free T4 Unclear when last adjustment  was Will decrease Synthroid to 150 mcg and have repeat thyroid function test done in the outpatient setting  GERD PPI  Chronic constipation: Will resume usual regimen when diverticulitis is better.    Obesity: Estimated body mass index is 32.71 kg/m as calculated from the following:   Height as of this encounter: 5\' 10"  (1.778 m).   Weight as of this encounter: 103.4 kg.   Code status:   Code Status: Full Code   DVT Prophylaxis: enoxaparin (LOVENOX) injection 40 mg Start: 01/19/23 0930 SCDs Start: 01/18/23 1619   Family Communication: None at bedside   Disposition Plan: Status is: Inpatient Remains inpatient appropriate because: Severity of illness   Planned Discharge Destination:Home   Diet: Diet Order             Diet NPO time specified Except for: Ice Chips, Sips with Meds  Diet effective now                     Antimicrobial agents: Anti-infectives (From admission, onward)    Start     Dose/Rate Route Frequency Ordered Stop   01/18/23 0600  piperacillin-tazobactam (ZOSYN) IVPB 3.375 g        3.375 g 12.5 mL/hr over 240 Minutes Intravenous Every 8 hours 01/17/23 2258     01/17/23 2215  piperacillin-tazobactam (ZOSYN) IVPB 3.375 g        3.375 g 12.5 mL/hr over 240 Minutes Intravenous  Once 01/17/23 2209 01/18/23 0212        MEDICATIONS: Scheduled Meds:  enoxaparin (LOVENOX) injection  40 mg Subcutaneous Q24H   Continuous Infusions:  sodium chloride 100 mL/hr at 01/18/23 1647   piperacillin-tazobactam (ZOSYN)  IV 3.375 g (01/19/23 0522)   PRN Meds:.acetaminophen **OR** acetaminophen, HYDROcodone-acetaminophen, morphine injection, ondansetron **OR** ondansetron (ZOFRAN) IV   I have personally reviewed following labs and imaging studies  LABORATORY DATA: CBC: Recent Labs  Lab 01/17/23 2041  WBC 14.8*  NEUTROABS 11.4*  HGB 16.0  HCT 49.0  MCV 90.1  PLT [id number]    Basic Metabolic Panel: Recent Labs  Lab 01/17/23 2041 01/18/23 1542   NA 137  --   K 4.2  --   CL 104  --   CO2 26  --   GLUCOSE 92  --   BUN 19  --   CREATININE 0.92  --   CALCIUM 9.5  --   MG  --  2.1    GFR: Estimated Creatinine Clearance: 115.8 mL/min (by C-G formula based on SCr of 0.92 mg/dL).  Liver Function Tests: Recent Labs  Lab 01/17/23 2041  AST 27  ALT 19  ALKPHOS 63  BILITOT 0.6  PROT 7.4  ALBUMIN 4.4   Recent Labs  Lab 01/17/23 2041  LIPASE 29   No results for input(s): "AMMONIA" in the last 168 hours.  Coagulation Profile: No results for input(s): "INR", "PROTIME" in the last 168 hours.  Cardiac Enzymes: No results for input(s): "CKTOTAL", "CKMB", "CKMBINDEX", "TROPONINI" in the last 168 hours.  BNP (last 3 results) No results for input(s): "PROBNP" in the last 8760 hours.  Lipid Profile: No results for input(s): "CHOL", "HDL", "LDLCALC", "TRIG", "CHOLHDL", "LDLDIRECT" in the last 72 hours.  Thyroid Function Tests: Recent Labs    01/18/23 1542  TSH 0.203*  FREET4 0.90    Anemia Panel: No results for input(s): "VITAMINB12", "FOLATE", "FERRITIN", "TIBC", "IRON", "RETICCTPCT" in the last 72 hours.  Urine analysis:    Component Value Date/Time   COLORURINE YELLOW 01/17/2023 1949   APPEARANCEUR CLEAR 01/17/2023 1949   LABSPEC 1.017 01/17/2023 1949   PHURINE 6.0 01/17/2023 1949   GLUCOSEU NEGATIVE 01/17/2023 1949   HGBUR NEGATIVE 01/17/2023 1949   BILIRUBINUR NEGATIVE 01/17/2023 1949   KETONESUR NEGATIVE 01/17/2023 1949   PROTEINUR NEGATIVE 01/17/2023 1949   UROBILINOGEN 0.2 02/05/2008 1527   NITRITE NEGATIVE 01/17/2023 1949   LEUKOCYTESUR NEGATIVE 01/17/2023 1949    Sepsis Labs: Lactic Acid, Venous    Component Value Date/Time   LATICACIDVEN 0.8 01/18/2023 [id number]    MICROBIOLOGY: No results found for this or any previous visit (from the past 240 hour(s)).  RADIOLOGY STUDIES/RESULTS: CT ABDOMEN PELVIS W CONTRAST  Result Date: 01/17/2023 CLINICAL DATA:  Left lower quadrant abdominal pain  beginning a couple hours ago. EXAM: CT ABDOMEN AND PELVIS WITH CONTRAST TECHNIQUE: Multidetector CT imaging of the abdomen and pelvis was performed using the standard protocol following bolus administration of intravenous contrast. RADIATION DOSE REDUCTION: This exam was performed according to the departmental dose-optimization program which includes automated exposure control, adjustment of the mA and/or kV according to patient size and/or use of iterative reconstruction technique. CONTRAST:  15mL OMNIPAQUE IOHEXOL 350 MG/ML SOLN COMPARISON:  CT 04/22/2021 FINDINGS: Lower chest: No acute abnormality. Hepatobiliary: No focal liver abnormality is seen. No gallstones, gallbladder wall thickening, or biliary dilatation. Pancreas: Unremarkable. No pancreatic ductal dilatation or surrounding inflammatory changes. Spleen: Normal in size without focal abnormality. Adrenals/Urinary Tract: Normal adrenal glands. No  urinary calculi or hydronephrosis. Unremarkable bladder. Stomach/Bowel: Sigmoid diverticulosis with mild wall thickening and adjacent pericolonic stranding about the proximal sigmoid colon in the left lower quadrant compatible with acute diverticulitis. Locules of free air throughout the anterior peritoneum compatible with perforation. No fluid collection or abscess. Moderate colonic stool load. Normal appendix. Unremarkable stomach. Vascular/Lymphatic: No significant vascular findings are present. No enlarged abdominal or pelvic lymph nodes. Reproductive: Unremarkable. Other: No abdominal wall hernia. Musculoskeletal: No acute osseous abnormality. IMPRESSION: Acute sigmoid diverticulitis with perforation. No fluid collection or abscess. Critical Value/emergent results were called by telephone at the time of interpretation on 01/17/2023 at 10:10 pm to provider Wynona Dove , who verbally acknowledged these results. Electronically Signed   By: Placido Sou M.D.   On: 01/17/2023 22:19     LOS: 1 day   Oren Binet, MD  [facility]    To contact the attending provider between 7A-7P or the covering provider during after hours 7P-7A, please log into the web site [email or web address] and access using universal [place] password for that web site. If you do not have the password, please call the hospital operator.  01/19/2023, 8:36 AM

## 2023-01-20 LAB — CBC
HCT: 41.2 % (ref 39.0–52.0)
Hemoglobin: 13.9 g/dL (ref 13.0–17.0)
MCH: 30.2 pg (ref 26.0–34.0)
MCHC: 33.7 g/dL (ref 30.0–36.0)
MCV: 89.6 fL (ref 80.0–100.0)
Platelets: 227 10*3/uL (ref 150–400)
RBC: 4.6 MIL/uL (ref 4.22–5.81)
RDW: 13.1 % (ref 11.5–15.5)
WBC: 7.8 10*3/uL (ref 4.0–10.5)
nRBC: 0 % (ref 0.0–0.2)

## 2023-01-20 MED ORDER — POLYETHYLENE GLYCOL 3350 17 G PO PACK
17.0000 g | PACK | Freq: Two times a day (BID) | ORAL | Status: DC
  Administered 2023-01-20 – 2023-01-21 (×2): 17 g via ORAL
  Filled 2023-01-20 (×2): qty 1

## 2023-01-20 NOTE — Progress Notes (Signed)
[facility] Progress Note     Subjective: Pain improving.  Tolerating CLD well with no increase in symptoms.  Some bladder spasms with voiding.  Objective: Vital signs in last 24 hours: Temp:  [97.9 F (36.6 C)-98.4 F (36.9 C)] 97.9 F (36.6 C) (03/16 0750) Pulse Rate:  [62-87] 62 (03/16 0750) Resp:  [14-18] 18 (03/16 0750) BP: (93-126)/(62-83) 114/83 (03/16 0750) SpO2:  [96 %] 96 % (03/16 0646) Last BM Date : 01/17/23  Intake/Output from previous day: 03/15 0701 - 03/16 0700 In: 1440.2 [P.O.:480; I.V.:960.2] Out: -  Intake/Output this shift: No intake/output data recorded.  PE: Gen:  Alert, NAD, pleasant Abd: soft, ND, +BS, no masses, hernias, or organomegaly. Mild LLQ TTP with no rebound or guarding  Lab Results:  Recent Labs    01/19/23 0803 01/20/23 0355  WBC 9.3 7.8  HGB 14.3 13.9  HCT 42.4 41.2  PLT 221 227   BMET Recent Labs    01/17/23 2041 01/19/23 0803  NA 137 138  K 4.2 4.0  CL 104 104  CO2 26 22  GLUCOSE 92 82  BUN 19 11  CREATININE 0.92 0.96  CALCIUM 9.5 8.7*   PT/INR No results for input(s): "LABPROT", "INR" in the last 72 hours. CMP     Component Value Date/Time   NA 138 01/19/2023 0803   K 4.0 01/19/2023 0803   CL 104 01/19/2023 0803   CO2 22 01/19/2023 0803   GLUCOSE 82 01/19/2023 0803   BUN 11 01/19/2023 0803   CREATININE 0.96 01/19/2023 0803   CALCIUM 8.7 (L) 01/19/2023 0803   PROT 7.4 01/17/2023 2041   ALBUMIN 4.4 01/17/2023 2041   AST 27 01/17/2023 2041   ALT 19 01/17/2023 2041   ALKPHOS 63 01/17/2023 2041   BILITOT 0.6 01/17/2023 2041   GFRNONAA >60 01/19/2023 0803   GFRAA >90 02/13/2015 1719   Lipase     Component Value Date/Time   LIPASE 29 01/17/2023 2041       Studies/Results: No results found.  Anti-infectives: Anti-infectives (From admission, onward)    Start     Dose/Rate Route Frequency Ordered Stop   01/18/23 0600  piperacillin-tazobactam (ZOSYN) IVPB 3.375 g        3.375 g 12.5  mL/hr over 240 Minutes Intravenous Every 8 hours 01/17/23 2258     01/17/23 2215  piperacillin-tazobactam (ZOSYN) IVPB 3.375 g        3.375 g 12.5 mL/hr over 240 Minutes Intravenous  Once 01/17/23 2209 01/18/23 [id number]        Assessment/Plan Perforated sigmoid diverticulitis - last colonoscopy about 2 years ago with Dr. Therisa Doyne. This is his first episode requiring admission, but he has had about 4-5 episodes over the last year  - CT 3/13 shows acute sigmoid diverticulitis with locules of free air consistent with perforation; no fluid collection or abscess - WBC normalized, afebrile, and pain improving. Continue IV zosyn. -ad to FLD today.  If tolerates, soft tomorrow and possibly home on oral abx later in the day  Hopefully this episode will resolve with conservative management. Given that he has had so many episodes over the last year recommend follow up with gastroenterologist (Dr. Therisa Doyne) for possible repeat colonoscopy and then with colorectal surgeon to discuss elective colectomy.   ID - zosyn VTE - SCDs, lovenox FEN - IVF, FLD Foley - none   Hypothyroidism GERD Chronic constipation  I reviewed last 24 h vitals and pain scores, last 48 h intake and output, and last  24 h labs and trends.    LOS: 2 days    Henreitta Cea, [facility] 01/20/2023, 10:18 AM Please see Amion for pager number during day hours 7:00am-4:30pm

## 2023-01-20 NOTE — Progress Notes (Addendum)
PROGRESS NOTE        PATIENT DETAILS Name: Dylan Mccall Age: 52 y.o. Sex: male Date of Birth: 02-Mar-1971 Admit Date: 01/17/2023 Admitting Physician Kayleen Memos, DO IO:[id number], [place], [place]  Brief Summary: Patient is a 52 y.o.  male with prior history of diverticulitis involving sigmoid colon, hypothyroidism, constipation-presented with abdominal pain x 1 day-found to have acute diverticulitis with perforation.  Significant events: 3/13>> admit to [facility].  Significant studies: 3/13>> CT abdomen/pelvis: Acute sigmoid diverticulitis with perforation.  No abscess.  Significant microbiology data: None  Procedures: None  Consults: CCS  Subjective: Less abdominal pain-tolerating clear liquids.  Objective: Vitals: Blood pressure 114/83, pulse 62, temperature 97.9 F (36.6 C), temperature source Oral, resp. rate 18, height 5\' 10"  (1.778 m), weight 103.4 kg, SpO2 96 %.   Exam: Gen Exam:Alert awake-not in any distress HEENT:atraumatic, normocephalic Chest: B/L clear to auscultation anteriorly CVS:S1S2 regular Abdomen: Soft-minimally tender in the pelvic area-mostly in the left side.  No peritoneal signs. Extremities:no edema Neurology: Non focal Skin: no rash  Pertinent Labs/Radiology:    Latest Ref Rng & Units 01/20/2023    3:55 AM 01/19/2023    8:03 AM 01/17/2023    8:41 PM  CBC  WBC 4.0 - 10.5 K/uL 7.8  9.3  14.8   Hemoglobin 13.0 - 17.0 g/dL 13.9  14.3  16.0   Hematocrit 39.0 - 52.0 % 41.2  42.4  49.0   Platelets 150 - 400 K/uL 227  221  256     Lab Results  Component Value Date   NA 138 01/19/2023   K 4.0 01/19/2023   CL 104 01/19/2023   CO2 22 01/19/2023      Assessment/Plan: Acute sigmoid diverticulitis with perforation Improving Remains on IV antibiotics Diet being slowly advanced General surgery following.  Hypothyroidism Continue Synthroid/Cytomel Follow with PCP-unclear when recent dosage adjustment was-TSH slightly  suppressed.    GERD PPI  Chronic constipation: Will resume usual regimen when diverticulitis is better.    Obesity: Estimated body mass index is 32.71 kg/m as calculated from the following:   Height as of this encounter: 5\' 10"  (1.778 m).   Weight as of this encounter: 103.4 kg.   Code status:   Code Status: Full Code   DVT Prophylaxis: enoxaparin (LOVENOX) injection 40 mg Start: 01/19/23 1000 SCDs Start: 01/18/23 1619   Family Communication: None at bedside   Disposition Plan: Status is: Inpatient Remains inpatient appropriate because: Severity of illness   Planned Discharge Destination:Home   Diet: Diet Order             Diet full liquid Room service appropriate? Yes; Fluid consistency: Thin  Diet effective now                     Antimicrobial agents: Anti-infectives (From admission, onward)    Start     Dose/Rate Route Frequency Ordered Stop   01/18/23 0600  piperacillin-tazobactam (ZOSYN) IVPB 3.375 g        3.375 g 12.5 mL/hr over 240 Minutes Intravenous Every 8 hours 01/17/23 2258     01/17/23 2215  piperacillin-tazobactam (ZOSYN) IVPB 3.375 g        3.375 g 12.5 mL/hr over 240 Minutes Intravenous  Once 01/17/23 2209 01/18/23 0212        MEDICATIONS: Scheduled Meds:  enoxaparin (LOVENOX) injection  40  mg Subcutaneous Q24H   feeding supplement  1 Container Oral BID BM   levothyroxine  87.5 mcg Oral Once per day on Mon Wed Thu Fri   And   levothyroxine  175 mcg Oral Once per day on Sun Tue Sat   liothyronine  10 mcg Oral Daily   pantoprazole  40 mg Oral Daily   Continuous Infusions:  sodium chloride Stopped (01/19/23 1435)   piperacillin-tazobactam (ZOSYN)  IV 3.375 g (01/20/23 0529)   PRN Meds:.acetaminophen **OR** acetaminophen, bisacodyl, docusate sodium, HYDROcodone-acetaminophen, morphine injection, ondansetron **OR** ondansetron (ZOFRAN) IV, sodium phosphate   I have personally reviewed following labs and imaging  studies  LABORATORY DATA: CBC: Recent Labs  Lab 01/17/23 2041 01/19/23 0803 01/20/23 0355  WBC 14.8* 9.3 7.8  NEUTROABS 11.4*  --   --   HGB 16.0 14.3 13.9  HCT 49.0 42.4 41.2  MCV 90.1 89.8 89.6  PLT 256 221 227     Basic Metabolic Panel: Recent Labs  Lab 01/17/23 2041 01/18/23 1542 01/19/23 0803  NA 137  --  138  K 4.2  --  4.0  CL 104  --  104  CO2 26  --  22  GLUCOSE 92  --  82  BUN 19  --  11  CREATININE 0.92  --  0.96  CALCIUM 9.5  --  8.7*  MG  --  2.1  --      GFR: Estimated Creatinine Clearance: 110.9 mL/min (by C-G formula based on SCr of 0.96 mg/dL).  Liver Function Tests: Recent Labs  Lab 01/17/23 2041  AST 27  ALT 19  ALKPHOS 63  BILITOT 0.6  PROT 7.4  ALBUMIN 4.4    Recent Labs  Lab 01/17/23 2041  LIPASE 29    No results for input(s): "AMMONIA" in the last 168 hours.  Coagulation Profile: No results for input(s): "INR", "PROTIME" in the last 168 hours.  Cardiac Enzymes: No results for input(s): "CKTOTAL", "CKMB", "CKMBINDEX", "TROPONINI" in the last 168 hours.  BNP (last 3 results) No results for input(s): "PROBNP" in the last 8760 hours.  Lipid Profile: No results for input(s): "CHOL", "HDL", "LDLCALC", "TRIG", "CHOLHDL", "LDLDIRECT" in the last 72 hours.  Thyroid Function Tests: Recent Labs    01/18/23 1542  TSH 0.203*  FREET4 0.90     Anemia Panel: No results for input(s): "VITAMINB12", "FOLATE", "FERRITIN", "TIBC", "IRON", "RETICCTPCT" in the last 72 hours.  Urine analysis:    Component Value Date/Time   COLORURINE YELLOW 01/17/2023 1949   APPEARANCEUR CLEAR 01/17/2023 1949   LABSPEC 1.017 01/17/2023 1949   PHURINE 6.0 01/17/2023 1949   GLUCOSEU NEGATIVE 01/17/2023 1949   HGBUR NEGATIVE 01/17/2023 1949   BILIRUBINUR NEGATIVE 01/17/2023 1949   KETONESUR NEGATIVE 01/17/2023 1949   PROTEINUR NEGATIVE 01/17/2023 1949   UROBILINOGEN 0.2 02/05/2008 1527   NITRITE NEGATIVE 01/17/2023 1949   LEUKOCYTESUR  NEGATIVE 01/17/2023 1949    Sepsis Labs: Lactic Acid, Venous    Component Value Date/Time   LATICACIDVEN 0.8 01/18/2023 [id number]    MICROBIOLOGY: No results found for this or any previous visit (from the past 240 hour(s)).  RADIOLOGY STUDIES/RESULTS: No results found.   LOS: 2 days   Oren Binet, MD  [facility]    To contact the attending provider between 7A-7P or the covering provider during after hours 7P-7A, please log into the web site [email or web address] and access using universal [place] password for that web site. If you do not have the password, please call the  hospital operator.  01/20/2023, 10:56 AM

## 2023-01-21 MED ORDER — METRONIDAZOLE 500 MG PO TABS: 500.0000 mg | ORAL_TABLET | Freq: Two times a day (BID) | ORAL | 0 refills | Status: AC

## 2023-01-21 MED ORDER — HYDROCODONE-ACETAMINOPHEN 5-325 MG PO TABS: 1.0000 | ORAL_TABLET | Freq: Four times a day (QID) | ORAL | 0 refills | Status: DC | PRN

## 2023-01-21 MED ORDER — CIPROFLOXACIN HCL 500 MG PO TABS: 500.0000 mg | ORAL_TABLET | Freq: Two times a day (BID) | ORAL | 0 refills | Status: AC

## 2023-01-21 NOTE — Discharge Summary (Signed)
PATIENT DETAILS Name: Dylan Mccall Age: 52 y.o. Sex: male Date of Birth: [birth year] MRN: [id number]. Admitting Physician: Kayleen Memos, DO [id number], Myrtha Mantis  Admit Date: 01/17/2023 Discharge date: 01/21/2023  Recommendations for Outpatient Follow-up:  Follow up with PCP in 1-2 weeks Please obtain CMP/CBC in one week  Admitted From:  Home  Disposition: Home   Discharge Condition: good  CODE STATUS:   Code Status: Full Code   Diet recommendation:  Diet Order             DIET SOFT Room service appropriate? Yes; Fluid consistency: Thin  Diet effective now           Diet general                    Brief Summary: Patient is a 52 y.o.  male with prior history of diverticulitis involving sigmoid colon, hypothyroidism, constipation-presented with abdominal pain x 1 day-found to have acute diverticulitis with perforation.   Significant events: 3/13>> admit to [facility].   Significant studies: 3/13>> CT abdomen/pelvis: Acute sigmoid diverticulitis with perforation.  No abscess.   Significant microbiology data: None   Procedures: None   Consults: CCS  Brief Hospital Course: Acute sigmoid diverticulitis with perforation Improved-managed with conservative measures/IV antibiotics Diet gradually advanced-by day of discharge tolerating soft diet Continue Cipro/Flagyl on discharge.  Patient need follow-up with GI for colonoscopy-and then follow-up with colorectal surgery for consideration of colectomy, as he has had recurrent diverticulitis in the past several years.   Hypothyroidism Continue Synthroid/Cytomel Follow with PCP-unclear when recent dosage adjustment was-TSH slightly suppressed.     GERD PPI   Chronic constipation: Resume usual outpatient regimen.   Obesity: Estimated body mass index is 32.71 kg/m as calculated from the following:   Height as of this encounter: 5\' 10"  (1.778 m).   Weight as of this encounter: 103.4 kg.      Discharge Diagnoses:  Principal Problem:   Acute diverticulitis with perforation Active Problems:   Leukocytosis   Hypothyroidism   Chronic constipation   GERD (gastroesophageal reflux disease)   Discharge Instructions:  Activity:  As tolerated  Discharge Instructions     Call MD for:  persistant nausea and vomiting   Complete by: As directed    Call MD for:  severe uncontrolled pain   Complete by: As directed    Diet general   Complete by: As directed    Discharge instructions   Complete by: As directed    Follow with Primary MD  Marda Stalker, PA-C in 1-2 weeks  Please follow-up with a gastroenterologist-Dr. Therisa Doyne for a colonoscopy in the next few weeks, you will then need to see a colorectal surgeon for possible colectomy.  Please get a complete blood count and chemistry panel checked by your Primary MD at your next visit, and again as instructed by your Primary MD.  Get Medicines reviewed and adjusted: Please take all your medications with you for your next visit with your Primary MD  Laboratory/radiological data: Please request your Primary MD to go over all hospital tests and procedure/radiological results at the follow up, please ask your Primary MD to get all Hospital records sent to his/her office.  In some cases, they will be blood work, cultures and biopsy results pending at the time of your discharge. Please request that your primary care M.D. follows up on these results.  Also Note the following: If you experience worsening of your admission symptoms, develop shortness of breath, life  threatening emergency, suicidal or homicidal thoughts you must seek medical attention immediately by calling 911 or calling your MD immediately  if symptoms less severe.  You must read complete instructions/literature along with all the possible adverse reactions/side effects for all the Medicines you take and that have been prescribed to you. Take any new Medicines after  you have completely understood and accpet all the possible adverse reactions/side effects.   Do not drive when taking Pain medications or sleeping medications (Benzodaizepines)  Do not take more than prescribed Pain, Sleep and Anxiety Medications. It is not advisable to combine anxiety,sleep and pain medications without talking with your primary care practitioner  Special Instructions: If you have smoked or chewed Tobacco  in the last 2 yrs please stop smoking, stop any regular Alcohol  and or any Recreational drug use.  Wear Seat belts while driving.  Please note: You were cared for by a hospitalist during your hospital stay. Once you are discharged, your primary care physician will handle any further medical issues. Please note that NO REFILLS for any discharge medications will be authorized once you are discharged, as it is imperative that you return to your primary care physician (or establish a relationship with a primary care physician if you do not have one) for your post hospital discharge needs so that they can reassess your need for medications and monitor your lab values.   Increase activity slowly   Complete by: As directed       Allergies as of 01/21/2023       Reactions   Erythromycin Nausea And Vomiting   Sulfa Antibiotics         Medication List     STOP taking these medications    meloxicam 15 MG tablet Commonly known as: MOBIC       TAKE these medications    acetaminophen 500 MG tablet Commonly known as: TYLENOL Take 500 mg by mouth every 6 (six) hours as needed.   Cetirizine HCl 10 MG Caps Take 1 capsule by mouth daily as needed (for allergies).   ciprofloxacin 500 MG tablet Commonly known as: Cipro Take 1 tablet (500 mg total) by mouth 2 (two) times daily for 7 days.   FISH OIL PO Take 2 tablets by mouth daily.   fluticasone 50 MCG/ACT nasal spray Commonly known as: FLONASE Place 2 sprays into both nostrils daily.   HYDROcodone-acetaminophen  5-325 MG tablet Commonly known as: NORCO/VICODIN Take 1 tablet by mouth every 6 (six) hours as needed for moderate pain.   ibuprofen 200 MG tablet Commonly known as: ADVIL Take 400 mg by mouth every 6 (six) hours as needed for moderate pain.   lansoprazole 30 MG capsule Commonly known as: PREVACID Take 30 mg by mouth daily.   linaclotide 72 MCG capsule Commonly known as: LINZESS TAKE 1 CAPSULE BY MOUTH AT  LEAST 30 MINUTES BEFORE  FIRST MEAL OF THE DAY ON AN EMPTY STOMACH ONCE DAILY   liothyronine 5 MCG tablet Commonly known as: CYTOMEL Take 10 mcg by mouth daily.   MAGNESIUM GLUCONATE PO Take 2 tablets by mouth daily.   metroNIDAZOLE 500 MG tablet Commonly known as: Flagyl Take 1 tablet (500 mg total) by mouth 2 (two) times daily for 7 days.   MULTIVITAMIN PO Take 1 tablet by mouth daily.   RED YEAST RICE PO Take 2 tablets by mouth daily.   Synthroid 175 MCG tablet Generic drug: levothyroxine 1/2 TABLET ON AN EMPTY STOMACH IN THE MORNING MONDAY, WED,  FRIDAY, 1 TABLET ALL OTHER DAYS   VITAMIN D PO Take 1 tablet by mouth daily.   VITRON-C PO Take 1 tablet by mouth daily.        Follow-up Information     Marda Stalker, PA-C. Schedule an appointment as soon as possible for a visit in 1 week(s).   Specialty: Family Medicine Contact information: [place] [address] [phone]         Ronnette Juniper, MD. Schedule an appointment as soon as possible for a visit in 2 week(s).   Specialty: Gastroenterology Contact information: [place] [address] [phone]         Ileana Roup, MD Follow up in 2 week(s).   Specialties: General Surgery, Colon and Rectal Surgery Contact information: [address] [place] [address] [phone]                Allergies  Allergen Reactions   Erythromycin Nausea And Vomiting   Sulfa Antibiotics      Other  Procedures/Studies: CT ABDOMEN PELVIS W CONTRAST  Result Date: 01/17/2023 CLINICAL DATA:  Left lower quadrant abdominal pain beginning a couple hours ago. EXAM: CT ABDOMEN AND PELVIS WITH CONTRAST TECHNIQUE: Multidetector CT imaging of the abdomen and pelvis was performed using the standard protocol following bolus administration of intravenous contrast. RADIATION DOSE REDUCTION: This exam was performed according to the departmental dose-optimization program which includes automated exposure control, adjustment of the mA and/or kV according to patient size and/or use of iterative reconstruction technique. CONTRAST:  80mL OMNIPAQUE IOHEXOL 350 MG/ML SOLN COMPARISON:  CT 04/22/2021 FINDINGS: Lower chest: No acute abnormality. Hepatobiliary: No focal liver abnormality is seen. No gallstones, gallbladder wall thickening, or biliary dilatation. Pancreas: Unremarkable. No pancreatic ductal dilatation or surrounding inflammatory changes. Spleen: Normal in size without focal abnormality. Adrenals/Urinary Tract: Normal adrenal glands. No urinary calculi or hydronephrosis. Unremarkable bladder. Stomach/Bowel: Sigmoid diverticulosis with mild wall thickening and adjacent pericolonic stranding about the proximal sigmoid colon in the left lower quadrant compatible with acute diverticulitis. Locules of free air throughout the anterior peritoneum compatible with perforation. No fluid collection or abscess. Moderate colonic stool load. Normal appendix. Unremarkable stomach. Vascular/Lymphatic: No significant vascular findings are present. No enlarged abdominal or pelvic lymph nodes. Reproductive: Unremarkable. Other: No abdominal wall hernia. Musculoskeletal: No acute osseous abnormality. IMPRESSION: Acute sigmoid diverticulitis with perforation. No fluid collection or abscess. Critical Value/emergent results were called by telephone at the time of interpretation on 01/17/2023 at 10:10 pm to provider Wynona Dove , who verbally  acknowledged these results. Electronically Signed   By: Placido Sou M.D.   On: 01/17/2023 22:19     TODAY-DAY OF DISCHARGE:  Subjective:   Dylan Mccall today has no headache,no chest abdominal pain,no new weakness tingling or numbness, feels much better wants to go home today.   Objective:   Blood pressure 108/71, pulse (!) 52, temperature 97.9 F (36.6 C), temperature source Oral, resp. rate 18, height 5\' 10"  (1.778 m), weight 103.4 kg, SpO2 98 %. No intake or output data in the 24 hours ending 01/21/23 1006 Filed Weights   01/17/23 1952  Weight: 103.4 kg    Exam: Awake Alert, Oriented *3, No new F.N deficits, Normal affect [place].AT,PERRAL Supple Neck,No JVD, No cervical lymphadenopathy appriciated.  Symmetrical Chest wall movement, Good air movement bilaterally, CTAB RRR,No Gallops,Rubs or new Murmurs, No Parasternal Heave +ve B.Sounds, Abd Soft, Non tender, No organomegaly appriciated, No rebound -guarding  or rigidity. No Cyanosis, Clubbing or edema, No new Rash or bruise   PERTINENT RADIOLOGIC STUDIES: No results found.   PERTINENT LAB RESULTS: CBC: Recent Labs    01/19/23 0803 01/20/23 0355  WBC 9.3 7.8  HGB 14.3 13.9  HCT 42.4 41.2  PLT 221 227   CMET CMP     Component Value Date/Time   NA 138 01/19/2023 0803   K 4.0 01/19/2023 0803   CL 104 01/19/2023 0803   CO2 22 01/19/2023 0803   GLUCOSE 82 01/19/2023 0803   BUN 11 01/19/2023 0803   CREATININE 0.96 01/19/2023 0803   CALCIUM 8.7 (L) 01/19/2023 0803   PROT 7.4 01/17/2023 2041   ALBUMIN 4.4 01/17/2023 2041   AST 27 01/17/2023 2041   ALT 19 01/17/2023 2041   ALKPHOS 63 01/17/2023 2041   BILITOT 0.6 01/17/2023 2041   GFRNONAA >60 01/19/2023 0803   GFRAA >90 02/13/2015 1719    GFR Estimated Creatinine Clearance: 110.9 mL/min (by C-G formula based on SCr of 0.96 mg/dL). No results for input(s): "LIPASE", "AMYLASE" in the last 72 hours. No results for input(s): "CKTOTAL", "CKMB", "CKMBINDEX",  "TROPONINI" in the last 72 hours. Invalid input(s): "POCBNP" No results for input(s): "DDIMER" in the last 72 hours. No results for input(s): "HGBA1C" in the last 72 hours. No results for input(s): "CHOL", "HDL", "LDLCALC", "TRIG", "CHOLHDL", "LDLDIRECT" in the last 72 hours. Recent Labs    01/18/23 1542  TSH 0.203*   No results for input(s): "VITAMINB12", "FOLATE", "FERRITIN", "TIBC", "IRON", "RETICCTPCT" in the last 72 hours. Coags: No results for input(s): "INR" in the last 72 hours.  Invalid input(s): "PT" Microbiology: No results found for this or any previous visit (from the past 240 hour(s)).  FURTHER DISCHARGE INSTRUCTIONS:  Get Medicines reviewed and adjusted: Please take all your medications with you for your next visit with your Primary MD  Laboratory/radiological data: Please request your Primary MD to go over all hospital tests and procedure/radiological results at the follow up, please ask your Primary MD to get all Hospital records sent to his/her office.  In some cases, they will be blood work, cultures and biopsy results pending at the time of your discharge. Please request that your primary care M.D. goes through all the records of your hospital data and follows up on these results.  Also Note the following: If you experience worsening of your admission symptoms, develop shortness of breath, life threatening emergency, suicidal or homicidal thoughts you must seek medical attention immediately by calling 911 or calling your MD immediately  if symptoms less severe.  You must read complete instructions/literature along with all the possible adverse reactions/side effects for all the Medicines you take and that have been prescribed to you. Take any new Medicines after you have completely understood and accpet all the possible adverse reactions/side effects.   Do not drive when taking Pain medications or sleeping medications (Benzodaizepines)  Do not take more than  prescribed Pain, Sleep and Anxiety Medications. It is not advisable to combine anxiety,sleep and pain medications without talking with your primary care practitioner  Special Instructions: If you have smoked or chewed Tobacco  in the last 2 yrs please stop smoking, stop any regular Alcohol  and or any Recreational drug use.  Wear Seat belts while driving.  Please note: You were cared for by a hospitalist during your hospital stay. Once you are discharged, your primary care physician will handle any further medical issues. Please note that NO REFILLS for any  discharge medications will be authorized once you are discharged, as it is imperative that you return to your primary care physician (or establish a relationship with a primary care physician if you do not have one) for your post hospital discharge needs so that they can reassess your need for medications and monitor your lab values.  Total Time spent coordinating discharge including counseling, education and face to face time equals greater than 30 minutes.  SignedOren Binet 01/21/2023 10:06 AM

## 2023-01-21 NOTE — Progress Notes (Signed)
Patient ID: Dylan Mccall, male   DOB: [birth year], 52 y.o.   MRN: [id number]      Subjective: Less pain Eating soft diet ROS negative except as listed above. Objective: Vital signs in last 24 hours: Temp:  [97.4 F (36.3 C)-98 F (36.7 C)] 97.9 F (36.6 C) (03/17 0700) Pulse Rate:  [52-66] 52 (03/17 0300) Resp:  [15-18] 18 (03/17 0700) BP: (106-123)/(63-81) 108/71 (03/17 0700) SpO2:  [97 %-98 %] 98 % (03/17 0300) Last BM Date : 01/17/23  Intake/Output from previous day: No intake/output data recorded. Intake/Output this shift: No intake/output data recorded.  General appearance: alert and cooperative Resp: clear to auscultation bilaterally GI: soft, min tenderness LLQ  Lab Results: CBC  Recent Labs    01/19/23 0803 01/20/23 0355  WBC 9.3 7.8  HGB 14.3 13.9  HCT 42.4 41.2  PLT 221 227   BMET Recent Labs    01/19/23 0803  NA 138  K 4.0  CL 104  CO2 22  GLUCOSE 82  BUN 11  CREATININE 0.96  CALCIUM 8.7*   PT/INR No results for input(s): "LABPROT", "INR" in the last 72 hours. ABG No results for input(s): "PHART", "HCO3" in the last 72 hours.  Invalid input(s): "PCO2", "PO2"  Studies/Results: No results found.  Anti-infectives: Anti-infectives (From admission, onward)    Start     Dose/Rate Route Frequency Ordered Stop   01/18/23 0600  piperacillin-tazobactam (ZOSYN) IVPB 3.375 g        3.375 g 12.5 mL/hr over 240 Minutes Intravenous Every 8 hours 01/17/23 2258     01/17/23 2215  piperacillin-tazobactam (ZOSYN) IVPB 3.375 g        3.375 g 12.5 mL/hr over 240 Minutes Intravenous  Once 01/17/23 2209 01/18/23 [id number]       Assessment/Plan: Perforated sigmoid diverticulitis - last colonoscopy about 2 years ago with Dr. Therisa Doyne. This is his first episode requiring admission, but he has had about 4-5 episodes over the last year  - CT 3/13 shows acute sigmoid diverticulitis with locules of free air consistent with perforation; no fluid collection or  abscess - WBC normalized, afebrile, and pain improving - soft diet, D/C on PO ABX - recommend F/U with one of our colorectal surgeons: Dr. Marcello Moores, Dr. Dema Severin, or Dr. Johney Maine  ID - zosyn VTE - SCDs, lovenox FEN - IVF, FLD Foley - none   Hypothyroidism GERD Chronic constipation  I D/W Dr. Sloan Leiter  LOS: 3 days    Georganna Skeans, MD, MPH, FACS Trauma & General Surgery Use [email or web address] to contact on call provider  01/21/2023

## 2023-02-23 ENCOUNTER — Other Ambulatory Visit: Payer: Self-pay | Admitting: Urology

## 2023-02-27 ENCOUNTER — Ambulatory Visit: Payer: Self-pay | Admitting: General Surgery

## 2023-02-27 DIAGNOSIS — K5792 Diverticulitis of intestine, part unspecified, without perforation or abscess without bleeding: Secondary | ICD-10-CM

## 2023-02-27 NOTE — Progress Notes (Signed)
Surgery orders requested via Epic inbox. °

## 2023-03-05 NOTE — Patient Instructions (Signed)
SURGICAL WAITING ROOM VISITATION  Patients having surgery or a procedure may have no more than 2 support people in the waiting area - these visitors may rotate.    Children under the age of 67 must have an adult with them who is not the patient.  Due to an increase in RSV and influenza rates and associated hospitalizations, children ages 55 and under may not visit patients in [facility] hospitals.  If the patient needs to stay at the hospital during part of their recovery, the visitor guidelines for inpatient rooms apply. Pre-op nurse will coordinate an appropriate time for 1 support person to accompany patient in pre-op.  This support person may not rotate.    Please refer to the [facility] website for the visitor guidelines for Inpatients (after your surgery is over and you are in a regular room).       Your procedure is scheduled on:  03/13/2023    Report to [facility] Main Entrance    Report to admitting at  1015 AM   Call this number if you have problems the morning of surgery [phone]   Clear liquid diet on day of bowel prep.              Follow bowel prep instructions per MD.     After Midnight you may have the following liquids until ___ 0915___ AM  DAY OF SURGERY  Water Non-Citrus Juices (without pulp, NO RED-Apple, White grape, White cranberry) Black Coffee (NO MILK/CREAM OR CREAMERS, sugar ok)  Clear Tea (NO MILK/CREAM OR CREAMERS, sugar ok) regular and decaf                             Plain Jell-O (NO RED)                                           Fruit ices (not with fruit pulp, NO RED)                                     Popsicles (NO RED)                                                               Sports drinks like Gatorade (NO RED)              Drink 2 Ensure/G2 drinks AT 10:00 PM the night before surgery.        The day of surgery:  Drink ONE (1) Pre-Surgery Clear Ensure or G2 at   0915 AM  ( have completed by )  the morning of surgery.  Drink in one sitting. Do not sip.  This drink was given to you during your hospital  pre-op appointment visit. Nothing else to drink after completing the  Pre-Surgery Clear Ensure or G2.          If you have questions, please contact your surgeon's office.       Oral Hygiene is also important to reduce your risk of infection.  Remember - BRUSH YOUR TEETH THE MORNING OF SURGERY WITH YOUR REGULAR TOOTHPASTE  DENTURES WILL BE REMOVED PRIOR TO SURGERY PLEASE DO NOT APPLY "Poly grip" OR ADHESIVES!!!   Do NOT smoke after Midnight   Take these medicines the morning of surgery with A SIP OF WATER:  flonase, prevacid, synthroid, cytomel , synthroid   DO NOT TAKE ANY ORAL DIABETIC MEDICATIONS DAY OF YOUR SURGERY  Bring CPAP mask and tubing day of surgery.                              You may not have any metal on your body including hair pins, jewelry, and body piercing             Do not wear make-up, lotions, powders, perfumes/cologne, or deodorant  Do not wear nail polish including gel and S&S, artificial/acrylic nails, or any other type of covering on natural nails including finger and toenails. If you have artificial nails, gel coating, etc. that needs to be removed by a nail salon please have this removed prior to surgery or surgery may need to be canceled/ delayed if the surgeon/ anesthesia feels like they are unable to be safely monitored.   Do not shave  48 hours prior to surgery.               Men may shave face and neck.   Do not bring valuables to the hospital. [facility] IS NOT             RESPONSIBLE   FOR VALUABLES.   Contacts, glasses, dentures or bridgework may not be worn into surgery.   Bring small overnight bag day of surgery.   DO NOT BRING YOUR HOME MEDICATIONS TO THE HOSPITAL. PHARMACY WILL DISPENSE MEDICATIONS LISTED ON YOUR MEDICATION LIST TO YOU DURING YOUR ADMISSION IN THE HOSPITAL!    Patients discharged on the day of  surgery will not be allowed to drive home.  Someone NEEDS to stay with you for the first 24 hours after anesthesia.   Special Instructions: Bring a copy of your healthcare power of attorney and living will documents the day of surgery if you haven't scanned them before.              Please read over the following fact sheets you were given: IF YOU HAVE QUESTIONS ABOUT YOUR PRE-OP INSTRUCTIONS PLEASE CALL [phone]   If you received a COVID test during your pre-op visit  it is requested that you wear a mask when out in public, stay away from anyone that may not be feeling well and notify your surgeon if you develop symptoms. If you test positive for Covid or have been in contact with anyone that has tested positive in the last 10 days please notify you surgeon.    [facility] - Preparing for Surgery Before surgery, you can play an important role.  Because skin is not sterile, your skin needs to be as free of germs as possible.  You can reduce the number of germs on your skin by washing with CHG (chlorahexidine gluconate) soap before surgery.  CHG is an antiseptic cleaner which kills germs and bonds with the skin to continue killing germs even after washing. Please DO NOT use if you have an allergy to CHG or antibacterial soaps.  If your skin becomes reddened/irritated stop using the CHG and inform your nurse when you arrive at Short Stay. Do not shave (  including legs and underarms) for at least 48 hours prior to the first CHG shower.  You may shave your face/neck. Please follow these instructions carefully:  1.  Shower with CHG Soap the night before surgery and the  morning of Surgery.  2.  If you choose to wash your hair, wash your hair first as usual with your  normal  shampoo.  3.  After you shampoo, rinse your hair and body thoroughly to remove the  shampoo.                           4.  Use CHG as you would any other liquid soap.  You can apply chg directly  to the skin and wash                        Gently with a scrungie or clean washcloth.  5.  Apply the CHG Soap to your body ONLY FROM THE NECK DOWN.   Do not use on face/ open                           Wound or open sores. Avoid contact with eyes, ears mouth and genitals (private parts).                       Wash face,  Genitals (private parts) with your normal soap.             6.  Wash thoroughly, paying special attention to the area where your surgery  will be performed.  7.  Thoroughly rinse your body with warm water from the neck down.  8.  DO NOT shower/wash with your normal soap after using and rinsing off  the CHG Soap.                9.  Pat yourself dry with a clean towel.            10.  Wear clean pajamas.            11.  Place clean sheets on your bed the night of your first shower and do not  sleep with pets. Day of Surgery : Do not apply any lotions/deodorants the morning of surgery.  Please wear clean clothes to the hospital/surgery center.  FAILURE TO FOLLOW THESE INSTRUCTIONS MAY RESULT IN THE CANCELLATION OF YOUR SURGERY PATIENT SIGNATURE_________________________________  NURSE SIGNATURE__________________________________  ________________________________________________________________________

## 2023-03-05 NOTE — Progress Notes (Signed)
Anesthesia Review:  PCP: Cardiologist : DR Carolan Clines- LOV 12/08/21  Chest x-ray : EKG : Echo : Stress test: Cardiac Cath :  Activity level:  Sleep Study/ CPAP : Fasting Blood Sugar :      / Checks Blood Sugar -- times a day:   Blood Thinner/ Instructions /Last Dose: ASA / Instructions/ Last Dose :

## 2023-03-06 ENCOUNTER — Encounter (HOSPITAL_COMMUNITY): Payer: Self-pay

## 2023-03-06 ENCOUNTER — Encounter (HOSPITAL_COMMUNITY)
Admission: RE | Admit: 2023-03-06 | Discharge: 2023-03-06 | Disposition: A | Discharge status: Home / Self Care | Payer: BC Managed Care – PPO | Source: Ambulatory Visit | Attending: General Surgery | Admitting: General Surgery

## 2023-03-06 ENCOUNTER — Other Ambulatory Visit: Payer: Self-pay

## 2023-03-06 VITALS — BP 130/91 | HR 69 | Temp 97.9°F | Resp 16 | Ht 70.0 in | Wt 227.1 lb

## 2023-03-06 DIAGNOSIS — E669 Obesity, unspecified: Secondary | ICD-10-CM | POA: Insufficient documentation

## 2023-03-06 DIAGNOSIS — Z01818 Encounter for other preprocedural examination: Secondary | ICD-10-CM | POA: Diagnosis present

## 2023-03-06 DIAGNOSIS — K219 Gastro-esophageal reflux disease without esophagitis: Secondary | ICD-10-CM | POA: Diagnosis not present

## 2023-03-06 DIAGNOSIS — E039 Hypothyroidism, unspecified: Secondary | ICD-10-CM | POA: Diagnosis not present

## 2023-03-06 DIAGNOSIS — K5792 Diverticulitis of intestine, part unspecified, without perforation or abscess without bleeding: Secondary | ICD-10-CM | POA: Insufficient documentation

## 2023-03-06 HISTORY — DX: Gastro-esophageal reflux disease without esophagitis: K21.9

## 2023-03-06 HISTORY — DX: Hypothyroidism, unspecified: E03.9

## 2023-03-06 LAB — BASIC METABOLIC PANEL
Anion gap: 9 (ref 5–15)
BUN: 19 mg/dL (ref 6–20)
CO2: 23 mmol/L (ref 22–32)
Calcium: 9.1 mg/dL (ref 8.9–10.3)
Chloride: 104 mmol/L (ref 98–111)
Creatinine, Ser: 0.75 mg/dL (ref 0.61–1.24)
GFR, Estimated: >60 mL/min (ref >60)
Glucose, Bld: 88 mg/dL (ref 70–99)
Potassium: 4.3 mmol/L (ref 3.5–5.1)
Sodium: 136 mmol/L (ref 135–145)

## 2023-03-06 LAB — CBC
HCT: 46.6 % (ref 39.0–52.0)
Hemoglobin: 15.4 g/dL (ref 13.0–17.0)
MCH: 29.8 pg (ref 26.0–34.0)
MCHC: 33 g/dL (ref 30.0–36.0)
MCV: 90.1 fL (ref 80.0–100.0)
Platelets: 313 10*3/uL (ref 150–400)
RBC: 5.17 MIL/uL (ref 4.22–5.81)
RDW: 13.3 % (ref 11.5–15.5)
WBC: 6.8 10*3/uL (ref 4.0–10.5)
nRBC: 0 % (ref 0.0–0.2)

## 2023-03-06 LAB — HEMOGLOBIN A1C
Hgb A1c MFr Bld: 5.6 % (ref 4.8–5.6)
Mean Plasma Glucose: 114.02 mg/dL

## 2023-03-06 LAB — TYPE AND SCREEN

## 2023-03-06 NOTE — Progress Notes (Signed)
Case: [id number] Date/Time: 03/13/23 1214   Procedures:      LAPAROSCOPIC SIGMOID COLON RESECTION WITH ANASTOMOSIS     CYSTOSCOPY with FIREFLY INJECTION   Anesthesia type: General   Pre-op diagnosis: DIVERTICULITIS   Location: [facility] ROOM 01 / [facility]   Surgeons: Kinsinger, De Blanch, MD; Crista Elliot, MD       DISCUSSION: Dylan Mccall is a 52 yo male who presents to PAT clinic prior to surgery listed above. PMH significant for recurrent diverticulitis. He was recently hospitalized for diverticulitis with perforation from 01/17/23-01/21/23. He was referred to colorectal surgery and is now scheduled for sigmoidectomy.  Other PMH includes hypothyroidism, GERD, obesity  VS: BP (!) 130/91   Pulse 69   Temp 36.6 C (Oral)   Resp 16   Ht 5\' 10"  (1.778 m)   Wt 103 kg   SpO2 98%   BMI 32.58 kg/m   PROVIDERS: Jarrett Soho, PA-C   LABS: Labs reviewed: Acceptable for surgery. (all labs ordered are listed, but only abnormal results are displayed)  Labs Reviewed  HEMOGLOBIN A1C  BASIC METABOLIC PANEL  CBC  TYPE AND SCREEN     IMAGES:  CT A/P 01/17/23:  IMPRESSION: Acute sigmoid diverticulitis with perforation. No fluid collection or abscess.  EKG 03/06/23:  NSR   CV: n/a  Past Medical History:  Diagnosis Date   Diverticulitis    GERD (gastroesophageal reflux disease)    Hypothyroidism    Thyroid disease     Past Surgical History:  Procedure Laterality Date   lipoma removed left shoulder      right ankle surgery      WISDOM TOOTH EXTRACTION      MEDICATIONS:  acetaminophen (TYLENOL) 500 MG tablet   cetirizine (ZYRTEC) 10 MG tablet   Cholecalciferol (VITAMIN D) 50 MCG (2000 UT) CAPS   fluticasone (FLONASE) 50 MCG/ACT nasal spray   HYDROcodone-acetaminophen (NORCO/VICODIN) 5-325 MG tablet   ibuprofen (ADVIL,MOTRIN) 200 MG tablet   Iron-Vitamin C (VITRON-C PO)   lansoprazole (PREVACID) 30 MG capsule   linaclotide (LINZESS) 72 MCG capsule    liothyronine (CYTOMEL) 5 MCG tablet   MAGNESIUM GLYCINATE PO   Multiple Vitamin (MULTIVITAMIN PO)   Omega-3 Fatty Acids (FISH OIL PO)   polyethylene glycol (MIRALAX / GLYCOLAX) 17 g packet   Probiotic Product (ALIGN PO)   Psyllium 95 % POWD   Red Yeast Rice Extract (RED YEAST RICE PO)   SYNTHROID 175 MCG tablet   No current facility-administered medications for this encounter.   Marcille Blanco [facility] Short Stay/Anesthesiology [facility] Phone [phone] 03/06/2023 3:33 PM

## 2023-03-13 ENCOUNTER — Encounter (HOSPITAL_COMMUNITY): Payer: Self-pay | Admitting: General Surgery

## 2023-03-13 ENCOUNTER — Inpatient Hospital Stay (HOSPITAL_COMMUNITY): Payer: BC Managed Care – PPO | Admitting: Anesthesiology

## 2023-03-13 ENCOUNTER — Encounter (HOSPITAL_COMMUNITY)
Admission: RE | Disposition: A | Discharge status: Home / Self Care | Payer: Self-pay | Source: Ambulatory Visit | Attending: General Surgery

## 2023-03-13 ENCOUNTER — Other Ambulatory Visit: Payer: Self-pay

## 2023-03-13 ENCOUNTER — Inpatient Hospital Stay (HOSPITAL_COMMUNITY): Payer: BC Managed Care – PPO | Admitting: Medical

## 2023-03-13 ENCOUNTER — Inpatient Hospital Stay (HOSPITAL_COMMUNITY)
Admission: RE | Admit: 2023-03-13 | Discharge: 2023-03-16 | DRG: 331 | Disposition: A | Discharge status: Home / Self Care | Payer: BC Managed Care – PPO | Source: Ambulatory Visit | Attending: General Surgery | Admitting: General Surgery

## 2023-03-13 DIAGNOSIS — Z881 Allergy status to other antibiotic agents status: Secondary | ICD-10-CM

## 2023-03-13 DIAGNOSIS — Z8261 Family history of arthritis: Secondary | ICD-10-CM | POA: Diagnosis not present

## 2023-03-13 DIAGNOSIS — Z833 Family history of diabetes mellitus: Secondary | ICD-10-CM | POA: Diagnosis not present

## 2023-03-13 DIAGNOSIS — Z8249 Family history of ischemic heart disease and other diseases of the circulatory system: Secondary | ICD-10-CM

## 2023-03-13 DIAGNOSIS — Z79890 Hormone replacement therapy (postmenopausal): Secondary | ICD-10-CM | POA: Diagnosis not present

## 2023-03-13 DIAGNOSIS — K5732 Diverticulitis of large intestine without perforation or abscess without bleeding: Principal | ICD-10-CM | POA: Diagnosis present

## 2023-03-13 DIAGNOSIS — E039 Hypothyroidism, unspecified: Secondary | ICD-10-CM | POA: Diagnosis present

## 2023-03-13 DIAGNOSIS — K660 Peritoneal adhesions (postprocedural) (postinfection): Secondary | ICD-10-CM | POA: Diagnosis present

## 2023-03-13 DIAGNOSIS — E669 Obesity, unspecified: Secondary | ICD-10-CM | POA: Diagnosis present

## 2023-03-13 DIAGNOSIS — Z01818 Encounter for other preprocedural examination: Secondary | ICD-10-CM

## 2023-03-13 DIAGNOSIS — Z6832 Body mass index (BMI) 32.0-32.9, adult: Secondary | ICD-10-CM | POA: Diagnosis not present

## 2023-03-13 DIAGNOSIS — Z882 Allergy status to sulfonamides status: Secondary | ICD-10-CM | POA: Diagnosis not present

## 2023-03-13 DIAGNOSIS — Z79899 Other long term (current) drug therapy: Secondary | ICD-10-CM | POA: Diagnosis not present

## 2023-03-13 HISTORY — PX: COLON RESECTION: SHX5231

## 2023-03-13 LAB — CBC
HCT: 45.2 % (ref 39.0–52.0)
Hemoglobin: 14.8 g/dL (ref 13.0–17.0)
MCH: 29.7 pg (ref 26.0–34.0)
MCHC: 32.7 g/dL (ref 30.0–36.0)
MCV: 90.6 fL (ref 80.0–100.0)
Platelets: 273 10*3/uL (ref 150–400)
RBC: 4.99 MIL/uL (ref 4.22–5.81)
RDW: 13.4 % (ref 11.5–15.5)
WBC: 11 10*3/uL — ABNORMAL HIGH (ref 4.0–10.5)
nRBC: 0 % (ref 0.0–0.2)

## 2023-03-13 LAB — ABO/RH: ABO/RH(D): O NEG

## 2023-03-13 LAB — TYPE AND SCREEN
ABO/RH(D): O NEG
Antibody Screen: NEGATIVE

## 2023-03-13 LAB — CREATININE, SERUM
Creatinine, Ser: 0.97 mg/dL (ref 0.61–1.24)
GFR, Estimated: >60 mL/min (ref >60)

## 2023-03-13 SURGERY — LAPAROSCOPIC SIGMOID COLON RESECTION
Anesthesia: General

## 2023-03-13 MED ORDER — DIPHENHYDRAMINE HCL 25 MG PO CAPS: 25.0000 mg | ORAL_CAPSULE | Freq: Four times a day (QID) | ORAL | Status: DC | PRN

## 2023-03-13 MED ORDER — HEPARIN SODIUM (PORCINE) 5000 UNIT/ML IJ SOLN
5000.0000 [IU] | Freq: Once | INTRAMUSCULAR | Status: AC
  Administered 2023-03-13: 5000 [IU] via SUBCUTANEOUS
  Filled 2023-03-13: qty 1

## 2023-03-13 MED ORDER — SUGAMMADEX SODIUM 200 MG/2ML IV SOLN
INTRAVENOUS | Status: DC | PRN
  Administered 2023-03-13: 200 mg via INTRAVENOUS

## 2023-03-13 MED ORDER — LACTATED RINGERS IV SOLN: INTRAVENOUS | Status: DC

## 2023-03-13 MED ORDER — GABAPENTIN 300 MG PO CAPS
300.0000 mg | ORAL_CAPSULE | ORAL | Status: AC
  Administered 2023-03-13: 300 mg via ORAL
  Filled 2023-03-13: qty 1

## 2023-03-13 MED ORDER — DEXAMETHASONE SODIUM PHOSPHATE 10 MG/ML IJ SOLN
INTRAMUSCULAR | Status: AC
  Filled 2023-03-13: qty 1

## 2023-03-13 MED ORDER — DIPHENHYDRAMINE HCL 50 MG/ML IJ SOLN: 25.0000 mg | Freq: Four times a day (QID) | INTRAMUSCULAR | Status: DC | PRN

## 2023-03-13 MED ORDER — FENTANYL CITRATE PF 50 MCG/ML IJ SOSY
25.0000 ug | PREFILLED_SYRINGE | INTRAMUSCULAR | Status: DC | PRN
  Administered 2023-03-13 (×2): 50 ug via INTRAVENOUS

## 2023-03-13 MED ORDER — LIDOCAINE HCL (PF) 2 % IJ SOLN
INTRAMUSCULAR | Status: AC
  Filled 2023-03-13: qty 5

## 2023-03-13 MED ORDER — OXYCODONE HCL 5 MG PO TABS
5.0000 mg | ORAL_TABLET | Freq: Once | ORAL | Status: AC | PRN
  Administered 2023-03-13: 5 mg via ORAL

## 2023-03-13 MED ORDER — ENSURE PRE-SURGERY PO LIQD
592.0000 mL | Freq: Once | ORAL | Status: DC
  Filled 2023-03-13: qty 592

## 2023-03-13 MED ORDER — MIDAZOLAM HCL 2 MG/2ML IJ SOLN
INTRAMUSCULAR | Status: AC
  Filled 2023-03-13: qty 2

## 2023-03-13 MED ORDER — ROCURONIUM BROMIDE 10 MG/ML (PF) SYRINGE
PREFILLED_SYRINGE | INTRAVENOUS | Status: DC | PRN
  Administered 2023-03-13 (×2): 20 mg via INTRAVENOUS
  Administered 2023-03-13: 60 mg via INTRAVENOUS
  Administered 2023-03-13 (×2): 20 mg via INTRAVENOUS

## 2023-03-13 MED ORDER — SODIUM CHLORIDE 0.45 % IV SOLN: INTRAVENOUS | Status: DC

## 2023-03-13 MED ORDER — ROCURONIUM BROMIDE 10 MG/ML (PF) SYRINGE
PREFILLED_SYRINGE | INTRAVENOUS | Status: AC
  Filled 2023-03-13: qty 10

## 2023-03-13 MED ORDER — ONDANSETRON HCL 4 MG PO TABS: 4.0000 mg | ORAL_TABLET | Freq: Four times a day (QID) | ORAL | Status: DC | PRN

## 2023-03-13 MED ORDER — LEVOTHYROXINE SODIUM 175 MCG PO TABS: 87.5000 ug | ORAL_TABLET | ORAL | Status: DC

## 2023-03-13 MED ORDER — BUPIVACAINE LIPOSOME 1.3 % IJ SUSP
INTRAMUSCULAR | Status: DC | PRN
  Administered 2023-03-13: 50 mL

## 2023-03-13 MED ORDER — AMISULPRIDE (ANTIEMETIC) 5 MG/2ML IV SOLN
INTRAVENOUS | Status: AC
  Administered 2023-03-13: 10 mg via INTRAVENOUS
  Filled 2023-03-13: qty 4

## 2023-03-13 MED ORDER — SODIUM CHLORIDE 0.9 % IV SOLN
2.0000 g | INTRAVENOUS | Status: AC
  Administered 2023-03-13: 2 g via INTRAVENOUS
  Filled 2023-03-13: qty 2

## 2023-03-13 MED ORDER — FLUTICASONE PROPIONATE 50 MCG/ACT NA SUSP: 2.0000 | Freq: Every day | NASAL | Status: DC | PRN

## 2023-03-13 MED ORDER — ACETAMINOPHEN 500 MG PO TABS
1000.0000 mg | ORAL_TABLET | ORAL | Status: AC
  Administered 2023-03-13: 1000 mg via ORAL
  Filled 2023-03-13: qty 2

## 2023-03-13 MED ORDER — FENTANYL CITRATE (PF) 100 MCG/2ML IJ SOLN
INTRAMUSCULAR | Status: DC | PRN
  Administered 2023-03-13: 50 ug via INTRAVENOUS
  Administered 2023-03-13: 100 ug via INTRAVENOUS
  Administered 2023-03-13: 50 ug via INTRAVENOUS

## 2023-03-13 MED ORDER — BUPIVACAINE LIPOSOME 1.3 % IJ SUSP: 20.0000 mL | Freq: Once | INTRAMUSCULAR | Status: DC

## 2023-03-13 MED ORDER — FENTANYL CITRATE PF 50 MCG/ML IJ SOSY
PREFILLED_SYRINGE | INTRAMUSCULAR | Status: AC
  Administered 2023-03-13: 50 ug via INTRAVENOUS
  Filled 2023-03-13: qty 1

## 2023-03-13 MED ORDER — ACETAMINOPHEN 500 MG PO TABS
1000.0000 mg | ORAL_TABLET | Freq: Four times a day (QID) | ORAL | Status: DC
  Administered 2023-03-13 – 2023-03-16 (×10): 1000 mg via ORAL
  Filled 2023-03-13 (×11): qty 2

## 2023-03-13 MED ORDER — LEVOTHYROXINE SODIUM 75 MCG PO TABS
87.5000 ug | ORAL_TABLET | ORAL | Status: DC
  Administered 2023-03-14 – 2023-03-16 (×3): 87.5 ug via ORAL
  Filled 2023-03-13 (×3): qty 1

## 2023-03-13 MED ORDER — BUPIVACAINE HCL (PF) 0.25 % IJ SOLN
INTRAMUSCULAR | Status: AC
  Filled 2023-03-13: qty 30

## 2023-03-13 MED ORDER — ENOXAPARIN SODIUM 40 MG/0.4ML IJ SOSY
40.0000 mg | PREFILLED_SYRINGE | INTRAMUSCULAR | Status: DC
  Administered 2023-03-14 – 2023-03-16 (×3): 40 mg via SUBCUTANEOUS
  Filled 2023-03-13 (×3): qty 0.4

## 2023-03-13 MED ORDER — PANTOPRAZOLE SODIUM 40 MG PO TBEC
40.0000 mg | DELAYED_RELEASE_TABLET | Freq: Every day | ORAL | Status: DC
  Administered 2023-03-14 – 2023-03-16 (×3): 40 mg via ORAL
  Filled 2023-03-13 (×3): qty 1

## 2023-03-13 MED ORDER — ENSURE PRE-SURGERY PO LIQD: 296.0000 mL | Freq: Once | ORAL | Status: DC

## 2023-03-13 MED ORDER — BUPIVACAINE LIPOSOME 1.3 % IJ SUSP
INTRAMUSCULAR | Status: AC
  Filled 2023-03-13: qty 20

## 2023-03-13 MED ORDER — ONDANSETRON HCL 4 MG/2ML IJ SOLN: 4.0000 mg | Freq: Four times a day (QID) | INTRAMUSCULAR | Status: DC | PRN

## 2023-03-13 MED ORDER — ENSURE SURGERY PO LIQD
237.0000 mL | Freq: Two times a day (BID) | ORAL | Status: DC
  Administered 2023-03-14 – 2023-03-15 (×3): 237 mL via ORAL

## 2023-03-13 MED ORDER — LIDOCAINE 2% (20 MG/ML) 5 ML SYRINGE
INTRAMUSCULAR | Status: DC | PRN
  Administered 2023-03-13: 100 mg via INTRAVENOUS

## 2023-03-13 MED ORDER — KETAMINE HCL 10 MG/ML IJ SOLN
INTRAMUSCULAR | Status: DC | PRN
  Administered 2023-03-13: 40 mg via INTRAVENOUS
  Administered 2023-03-13: 10 mg via INTRAVENOUS

## 2023-03-13 MED ORDER — LIDOCAINE 20MG/ML (2%) 15 ML SYRINGE OPTIME
INTRAMUSCULAR | Status: DC | PRN
  Administered 2023-03-13: 1.5 mg/kg/h via INTRAVENOUS

## 2023-03-13 MED ORDER — STERILE WATER FOR INJECTION IJ SOLN
INTRAMUSCULAR | Status: AC
  Filled 2023-03-13: qty 10

## 2023-03-13 MED ORDER — PHENYLEPHRINE HCL-NACL 20-0.9 MG/250ML-% IV SOLN
INTRAVENOUS | Status: DC | PRN
  Administered 2023-03-13: 20 ug/min via INTRAVENOUS

## 2023-03-13 MED ORDER — AMISULPRIDE (ANTIEMETIC) 5 MG/2ML IV SOLN: 10.0000 mg | Freq: Once | INTRAVENOUS | Status: AC | PRN

## 2023-03-13 MED ORDER — CHLORHEXIDINE GLUCONATE 4 % EX SOLN: 1.0000 | Freq: Once | CUTANEOUS | Status: DC

## 2023-03-13 MED ORDER — ONDANSETRON HCL 4 MG/2ML IJ SOLN
INTRAMUSCULAR | Status: AC
  Filled 2023-03-13: qty 2

## 2023-03-13 MED ORDER — DEXAMETHASONE SODIUM PHOSPHATE 10 MG/ML IJ SOLN
INTRAMUSCULAR | Status: DC | PRN
  Administered 2023-03-13: 4 mg via INTRAVENOUS

## 2023-03-13 MED ORDER — NEOMYCIN SULFATE 500 MG PO TABS: 1000.0000 mg | ORAL_TABLET | ORAL | Status: DC

## 2023-03-13 MED ORDER — ORAL CARE MOUTH RINSE: 15.0000 mL | Freq: Once | OROMUCOSAL | Status: AC

## 2023-03-13 MED ORDER — LEVOTHYROXINE SODIUM 50 MCG PO TABS: 175.0000 ug | ORAL_TABLET | ORAL | Status: DC

## 2023-03-13 MED ORDER — OXYCODONE HCL 5 MG/5ML PO SOLN: 5.0000 mg | Freq: Once | ORAL | Status: AC | PRN

## 2023-03-13 MED ORDER — OXYCODONE HCL 5 MG PO TABS
5.0000 mg | ORAL_TABLET | Freq: Four times a day (QID) | ORAL | Status: DC | PRN
  Administered 2023-03-13 – 2023-03-16 (×10): 5 mg via ORAL
  Filled 2023-03-13 (×10): qty 1

## 2023-03-13 MED ORDER — ALVIMOPAN 12 MG PO CAPS
12.0000 mg | ORAL_CAPSULE | Freq: Two times a day (BID) | ORAL | Status: DC
  Administered 2023-03-14 – 2023-03-15 (×3): 12 mg via ORAL
  Filled 2023-03-13 (×4): qty 1

## 2023-03-13 MED ORDER — STERILE WATER FOR INJECTION IJ SOLN
INTRAMUSCULAR | Status: DC | PRN
  Administered 2023-03-13: 20 mL

## 2023-03-13 MED ORDER — MORPHINE SULFATE (PF) 2 MG/ML IV SOLN
2.0000 mg | INTRAVENOUS | Status: DC | PRN
  Administered 2023-03-13 – 2023-03-14 (×4): 2 mg via INTRAVENOUS
  Filled 2023-03-13 (×4): qty 1

## 2023-03-13 MED ORDER — CHLORHEXIDINE GLUCONATE 0.12 % MT SOLN
15.0000 mL | Freq: Once | OROMUCOSAL | Status: AC
  Administered 2023-03-13: 15 mL via OROMUCOSAL

## 2023-03-13 MED ORDER — FENTANYL CITRATE PF 50 MCG/ML IJ SOSY
PREFILLED_SYRINGE | INTRAMUSCULAR | Status: AC
  Filled 2023-03-13: qty 1

## 2023-03-13 MED ORDER — PROPOFOL 10 MG/ML IV BOLUS
INTRAVENOUS | Status: DC | PRN
  Administered 2023-03-13: 200 mg via INTRAVENOUS

## 2023-03-13 MED ORDER — FENTANYL CITRATE (PF) 250 MCG/5ML IJ SOLN
INTRAMUSCULAR | Status: AC
  Filled 2023-03-13: qty 5

## 2023-03-13 MED ORDER — PROPOFOL 10 MG/ML IV BOLUS
INTRAVENOUS | Status: AC
  Filled 2023-03-13: qty 20

## 2023-03-13 MED ORDER — LORATADINE 10 MG PO TABS
10.0000 mg | ORAL_TABLET | Freq: Every day | ORAL | Status: DC
  Administered 2023-03-14: 10 mg via ORAL
  Filled 2023-03-13 (×4): qty 1

## 2023-03-13 MED ORDER — METRONIDAZOLE 500 MG PO TABS: 1000.0000 mg | ORAL_TABLET | ORAL | Status: DC

## 2023-03-13 MED ORDER — ALVIMOPAN 12 MG PO CAPS
12.0000 mg | ORAL_CAPSULE | ORAL | Status: AC
  Administered 2023-03-13: 12 mg via ORAL
  Filled 2023-03-13: qty 1

## 2023-03-13 MED ORDER — OXYCODONE HCL 5 MG PO TABS
ORAL_TABLET | ORAL | Status: AC
  Filled 2023-03-13: qty 1

## 2023-03-13 MED ORDER — KETAMINE HCL 50 MG/5ML IJ SOSY
PREFILLED_SYRINGE | INTRAMUSCULAR | Status: AC
  Filled 2023-03-13: qty 5

## 2023-03-13 MED ORDER — METOPROLOL TARTRATE 5 MG/5ML IV SOLN: 5.0000 mg | Freq: Four times a day (QID) | INTRAVENOUS | Status: DC | PRN

## 2023-03-13 MED ORDER — LIDOCAINE HCL 2 % IJ SOLN
INTRAMUSCULAR | Status: AC
  Filled 2023-03-13: qty 20

## 2023-03-13 MED ORDER — MIDAZOLAM HCL 5 MG/5ML IJ SOLN
INTRAMUSCULAR | Status: DC | PRN
  Administered 2023-03-13: 2 mg via INTRAVENOUS

## 2023-03-13 MED ORDER — STERILE WATER FOR IRRIGATION IR SOLN
Status: DC | PRN
  Administered 2023-03-13: 3000 mL

## 2023-03-13 MED ORDER — LACTATED RINGERS IV SOLN: INTRAVENOUS | Status: DC | PRN

## 2023-03-13 SURGICAL SUPPLY — 92 items
ADAPTER GOLDBERG URETERAL (ADAPTER) IMPLANT
ADH SKN CLS APL DERMABOND .7 (GAUZE/BANDAGES/DRESSINGS) ×1
ADPR CATH 15X14FR FL DRN BG (ADAPTER) ×1
APL PRP STRL LF DISP 70% ISPRP (MISCELLANEOUS) ×1
APL SKNCLS STERI-STRIP NONHPOA (GAUZE/BANDAGES/DRESSINGS)
APPLIER CLIP 5 13 M/L LIGAMAX5 (MISCELLANEOUS)
APPLIER CLIP ROT 10 11.4 M/L (STAPLE)
APR CLP MED LRG 11.4X10 (STAPLE)
APR CLP MED LRG 5 ANG JAW (MISCELLANEOUS)
BAG COUNTER SPONGE SURGICOUNT (BAG) IMPLANT
BAG SPNG CNTER NS LX DISP (BAG)
BAG URO CATCHER STRL LF (MISCELLANEOUS) ×1 IMPLANT
BENZOIN TINCTURE PRP APPL 2/3 (GAUZE/BANDAGES/DRESSINGS) IMPLANT
BLADE EXTENDED COATED 6.5IN (ELECTRODE) IMPLANT
BNDG ADH 1X3 SHEER STRL LF (GAUZE/BANDAGES/DRESSINGS) IMPLANT
BNDG ADH THN 3X1 STRL LF (GAUZE/BANDAGES/DRESSINGS)
CABLE HIGH FREQUENCY MONO STRZ (ELECTRODE) IMPLANT
CATH URETL OPEN END 6FR 70 (CATHETERS) IMPLANT
CELLS DAT CNTRL 66122 CELL SVR (MISCELLANEOUS) IMPLANT
CHLORAPREP W/TINT 26 (MISCELLANEOUS) ×1 IMPLANT
CLIP APPLIE 5 13 M/L LIGAMAX5 (MISCELLANEOUS) IMPLANT
CLIP APPLIE ROT 10 11.4 M/L (STAPLE) IMPLANT
CLOTH BEACON ORANGE TIMEOUT ST (SAFETY) ×1 IMPLANT
COVER SURGICAL LIGHT HANDLE (MISCELLANEOUS) ×1 IMPLANT
DERMABOND ADVANCED .7 DNX12 (GAUZE/BANDAGES/DRESSINGS) IMPLANT
DRAIN CHANNEL 19F RND (DRAIN) IMPLANT
DRSG OPSITE POSTOP 4X10 (GAUZE/BANDAGES/DRESSINGS) IMPLANT
DRSG OPSITE POSTOP 4X6 (GAUZE/BANDAGES/DRESSINGS) IMPLANT
DRSG OPSITE POSTOP 4X8 (GAUZE/BANDAGES/DRESSINGS) IMPLANT
ELECT REM PT RETURN 15FT ADLT (MISCELLANEOUS) ×1 IMPLANT
ENDOLOOP SUT PDS II  0 18 (SUTURE)
ENDOLOOP SUT PDS II 0 18 (SUTURE) IMPLANT
GAUZE SPONGE 4X4 12PLY STRL (GAUZE/BANDAGES/DRESSINGS) IMPLANT
GLOVE BIOGEL PI IND STRL 7.0 (GLOVE) ×2 IMPLANT
GLOVE SURG LX STRL 7.5 STRW (GLOVE) ×1 IMPLANT
GLOVE SURG SS PI 7.0 STRL IVOR (GLOVE) ×2 IMPLANT
GOWN STRL REUS W/ TWL LRG LVL3 (GOWN DISPOSABLE) ×2 IMPLANT
GOWN STRL REUS W/TWL LRG LVL3 (GOWN DISPOSABLE) ×2
GUIDEWIRE ANG ZIPWIRE 038X150 (WIRE) IMPLANT
GUIDEWIRE STR DUAL SENSOR (WIRE) IMPLANT
IRRIG SUCT STRYKERFLOW 2 WTIP (MISCELLANEOUS) ×1
IRRIGATION SUCT STRKRFLW 2 WTP (MISCELLANEOUS) ×1 IMPLANT
KIT IMAGING PINPOINTPAQ (MISCELLANEOUS) IMPLANT
KIT PROCEDURE DVNC SI (MISCELLANEOUS) ×1 IMPLANT
KIT SIGMOIDOSCOPE (SET/KITS/TRAYS/PACK) IMPLANT
KIT TURNOVER KIT A (KITS) IMPLANT
MANIFOLD NEPTUNE II (INSTRUMENTS) ×1 IMPLANT
PACK COLON (CUSTOM PROCEDURE TRAY) ×1 IMPLANT
PACK CYSTO (CUSTOM PROCEDURE TRAY) ×1 IMPLANT
PAD POSITIONING PINK XL (MISCELLANEOUS) IMPLANT
PENCIL SMOKE EVACUATOR (MISCELLANEOUS) ×1 IMPLANT
PROTECTOR NERVE ULNAR (MISCELLANEOUS) IMPLANT
RELOAD STAPLE 60 2.6 WHT THN (STAPLE) IMPLANT
RELOAD STAPLE 60 3.6 BLU REG (STAPLE) IMPLANT
RELOAD STAPLER BLUE 60MM (STAPLE) ×1 IMPLANT
RELOAD STAPLER WHITE 60MM (STAPLE) IMPLANT
RETRACTOR WND ALEXIS 18 MED (MISCELLANEOUS) IMPLANT
RTRCTR WOUND ALEXIS 18CM MED (MISCELLANEOUS)
SCISSORS LAP 5X35 DISP (ENDOMECHANICALS) ×1 IMPLANT
SET TUBE SMOKE EVAC HIGH FLOW (TUBING) ×1 IMPLANT
SHEARS HARMONIC ACE PLUS 36CM (ENDOMECHANICALS) ×1 IMPLANT
SLEEVE Z-THREAD 5X100MM (TROCAR) ×2 IMPLANT
SPIKE FLUID TRANSFER (MISCELLANEOUS) ×1 IMPLANT
STAPLER ECHELON LONG 60 440 (INSTRUMENTS) IMPLANT
STAPLER ECHELON POWER CIR 29 (STAPLE) IMPLANT
STAPLER RELOAD BLUE 60MM (STAPLE) ×1
STAPLER RELOAD WHITE 60MM (STAPLE)
STAPLER VISISTAT 35W (STAPLE) IMPLANT
STRIP CLOSURE SKIN 1/2X4 (GAUZE/BANDAGES/DRESSINGS) IMPLANT
SUT MNCRL AB 4-0 PS2 18 (SUTURE) IMPLANT
SUT PDS AB 0 CT1 36 (SUTURE) IMPLANT
SUT PROLENE 2 0 KS (SUTURE) IMPLANT
SUT SILK 2 0 (SUTURE) ×1
SUT SILK 2 0 SH CR/8 (SUTURE) ×1 IMPLANT
SUT SILK 2-0 18XBRD TIE 12 (SUTURE) ×1 IMPLANT
SUT SILK 3 0 (SUTURE) ×1
SUT SILK 3 0 SH CR/8 (SUTURE) ×1 IMPLANT
SUT SILK 3-0 18XBRD TIE 12 (SUTURE) ×1 IMPLANT
SUT VIC AB 2-0 SH 27 (SUTURE) ×1
SUT VIC AB 2-0 SH 27X BRD (SUTURE) ×1 IMPLANT
SUT VICRYL 0 ENDOLOOP (SUTURE) IMPLANT
SYS LAPSCP GELPORT 120MM (MISCELLANEOUS)
SYS WOUND ALEXIS 18CM MED (MISCELLANEOUS) ×1
SYSTEM LAPSCP GELPORT 120MM (MISCELLANEOUS) IMPLANT
SYSTEM WOUND ALEXIS 18CM MED (MISCELLANEOUS) IMPLANT
TAPE CLOTH 4X10 WHT NS (GAUZE/BANDAGES/DRESSINGS) IMPLANT
TOWEL OR NON WOVEN STRL DISP B (DISPOSABLE) ×1 IMPLANT
TRAY FOLEY MTR SLVR 16FR STAT (SET/KITS/TRAYS/PACK) IMPLANT
TROCAR ADV FIXATION 12X100MM (TROCAR) ×1 IMPLANT
TROCAR Z-THREAD OPTICAL 5X100M (TROCAR) ×1 IMPLANT
TUBING CONNECTING 10 (TUBING) ×1 IMPLANT
TUBING UROLOGY SET (TUBING) IMPLANT

## 2023-03-13 NOTE — Anesthesia Procedure Notes (Signed)
Procedure Name: Intubation Date/Time: 03/13/2023 1:14 PM  Performed by: Epimenio Sarin, CRNAPre-anesthesia Checklist: Patient identified, Emergency Drugs available, Suction available, Patient being monitored and Timeout performed Patient Re-evaluated:Patient Re-evaluated prior to induction Oxygen Delivery Method: Circle system utilized Preoxygenation: Pre-oxygenation with 100% oxygen Induction Type: IV induction Ventilation: Mask ventilation without difficulty and Oral airway inserted - appropriate to patient size Laryngoscope Size: Mac and 3 Grade View: Grade I Tube type: Oral Tube size: 7.5 mm Number of attempts: 1 Airway Equipment and Method: Stylet Placement Confirmation: ETT inserted through vocal cords under direct vision, positive ETCO2 and breath sounds checked- equal and bilateral Secured at: 24 cm Tube secured with: Tape Dental Injury: Teeth and Oropharynx as per pre-operative assessment

## 2023-03-13 NOTE — Op Note (Addendum)
Preoperative diagnosis: diverticulitis  Postoperative diagnosis: same   Procedure: laparoscopic partial colectomy with colorectal anastomosis  Surgeon: Feliciana Rossetti, M.D.  Asst: Wenda Low, M.D.  Anesthesia: GETA  Indications for procedure: Dylan Mccall is a 52 y.o. year old male with symptoms of abdominal pain and CT scan confirming recurrent diverticulitis. After discussion of options he decided to proceed with sigmoid resection.  Description of procedure: The patient was brought into the operative suite. Anesthesia was administered with General endotracheal anesthesia. WHO checklist was applied. The patient was then placed in lithotomy position. The area was prepped and draped in the usual sterile fashion.  A left subcostal incision was made. A 5mm trocar was used to gain access to the peritoneal cavity by optical entry technique. Pneumoperitoneum was applied with a high flow and low pressure. The laparoscope was reinserted to confirm position. A right lateral 5 mm trocar and right lower 12 mm trocar was placed. A supraumbilical 5 mm trocar was placed  The colon was identified. It appeared adhered to the left abdominal wall. Adhesions of the colon to the abdominal wall were lysed with harmonic scalpel. The medial peritoneal reflection was entered, the vessels were identified. Further dissection of the retroperitoneum allowed identification of the ureter and gonadal vessels. Ureter was easily identified by ICG and stent placement. The mesentery of the colon was then taken with harmonic scalpel. This was continued distally until the rectosigmoid junction. A portion beyond inflammation at the rectosigmoid junction was chosen for division. A blue 60 mm echelon linear stapler was used to divide the large intestine. Total lysis of adhesion time was 30 minutes.  Next, The white line of Toldt was incised proximally and the left colon mobilized from the abdominal wall. A location of the distal left  colon was chosen for division.   Next, a Fannenstiel incision was made. Cautery was used to dissect through the subcutaneous tissue and the fascia was incised. A wound protector was put in place. The sigmoid colon specimen was removed. The proximal colon was extracted. A pursestring device was used to pursestring the colon with 2-0 prolene. The staple line was removed. The colon was dilated and a 29 mm anvil was chosen and pursestring was tied down around the anvil. The colon was placed back into the abdomen. Insufflation was resumed.   The rectum was inspected and dilator passed up to the staple line. Dr. Daphine Deutscher went and performed an anal exam. The stapler was introduced and spike brought out just anterior to the staple line. The anvil was connected and the stapler was clamped. Anastomosis was performed and stapler removed. Rigid proctoscopy was used to leak test. The anastomosis distended well. No bubbles were seen. The abdomen and pelvis were irrigated and fluid removed. Pneumoperitoneum was evacuated. Colorectal instrument change over was performed. The peritoneum was closed with 2-0 vicryl. The fascia was closed with 0 PDS in running fashion for the extraction site. Skin incisions were closed with 4-0 monocryl subcuticular stitch. Dermabond was put in place for dressings.   Ureteral stents were removed without issue.  Findings: diverticulitis of the sigmoid colon  Specimen: sigmoid colon  Implant: none   Blood loss: 20 ml  Local anesthesia:  50 ml Exparel:Marcaine Mix  Complications: none  Feliciana Rossetti, M.D. General, Bariatric, & Minimally Invasive Surgery [facility]

## 2023-03-13 NOTE — Anesthesia Postprocedure Evaluation (Signed)
Anesthesia Post Note  Patient: Dylan Mccall  Procedure(s) Performed: LAPAROSCOPIC SIGMOID COLON RESECTION WITH ANASTOMOSIS CYSTOSCOPY with FIREFLY INJECTION     Patient location during evaluation: PACU Anesthesia Type: General Level of consciousness: awake Pain management: pain level controlled Vital Signs Assessment: post-procedure vital signs reviewed and stable Respiratory status: spontaneous breathing, nonlabored ventilation and respiratory function stable Cardiovascular status: blood pressure returned to baseline and stable Postop Assessment: no apparent nausea or vomiting Anesthetic complications: no   No notable events documented.  Last Vitals:  Vitals:   03/13/23 1715 03/13/23 1730  BP: (!) 142/92 (!) 139/92  Pulse: 77 65  Resp: 15 14  Temp: (!) 36.3 C   SpO2: 96% 94%    Last Pain:  Vitals:   03/13/23 1730  TempSrc:   PainSc: Asleep                 Linton Rump

## 2023-03-13 NOTE — Anesthesia Preprocedure Evaluation (Addendum)
Anesthesia Evaluation  Patient identified by MRN, date of birth, ID band Patient awake    Reviewed: Allergy & Precautions, NPO status , Patient's Chart, lab work & pertinent test results  History of Anesthesia Complications Negative for: history of anesthetic complications  Airway Mallampati: III  TM Distance: >3 FB Neck ROM: Full    Dental  (+) Dental Advisory Given   Pulmonary neg pulmonary ROS   Pulmonary exam normal breath sounds clear to auscultation       Cardiovascular negative cardio ROS  Rhythm:Regular Rate:Normal     Neuro/Psych negative neurological ROS     GI/Hepatic Neg liver ROS,GERD  Medicated,,diverticulitis   Endo/Other  neg diabetesHypothyroidism    Renal/GU negative Renal ROS     Musculoskeletal   Abdominal  (+) + obese  Peds  Hematology negative hematology ROS (+)   Anesthesia Other Findings   Reproductive/Obstetrics                              Anesthesia Physical Anesthesia Plan  ASA: 3  Anesthesia Plan: General   Post-op Pain Management: Tylenol PO (pre-op)*   Induction: Intravenous  PONV Risk Score and Plan: 2 and Dexamethasone, Ondansetron and Treatment may vary due to age or medical condition  Airway Management Planned: Oral ETT and Video Laryngoscope Planned  Additional Equipment:   Intra-op Plan:   Post-operative Plan: Extubation in OR  Informed Consent: I have reviewed the patients History and Physical, chart, labs and discussed the procedure including the risks, benefits and alternatives for the proposed anesthesia with the patient or authorized representative who has indicated his/her understanding and acceptance.     Dental advisory given  Plan Discussed with: CRNA and Anesthesiologist  Anesthesia Plan Comments: (Plan for DL with glidescope available if needed.  Risks of general anesthesia discussed including, but not limited to,  sore throat, hoarse voice, chipped/damaged teeth, injury to vocal cords, nausea and vomiting, allergic reactions, lung infection, heart attack, stroke, and death. All questions answered. )         Anesthesia Quick Evaluation

## 2023-03-13 NOTE — Op Note (Signed)
Operative Note  Preoperative diagnosis:  1.  Diverticulitis  Postoperative diagnosis: 1.  Diverticulitis  Procedure(s): 1.  Cystoscopy with bilateral ureteral catheterization with instillation of ICG 2.  Bilateral temporary ureteral catheterization  Surgeon: Modena Slater, MD  Assistants: None  Anesthesia: General  Complications: None immediate  EBL: Minimal  Specimens: 1.  None  Drains/Catheters: 1.  Bilateral open-ended ureteral catheters 2.  Foley catheter  Intraoperative findings: Normal urethra and bladder  Indication: 52 year old male with diverticulitis presents for laparoscopic colectomy.  The above procedure was requested.  Description of procedure:  The patient was identified and consent was obtained.  The patient was taken to the operating room and placed in the supine position.  The patient was placed under general anesthesia.  Perioperative antibiotics were administered.  The patient was placed in dorsal lithotomy.  Patient was prepped and draped in a standard sterile fashion and a timeout was performed.  A 21 French rigid cystoscope was advanced into the urethra and into the bladder.  Complete cystoscopy was performed with no abnormal findings.  The left ureter was cannulated with a sensor wire which was advanced up to the kidney.  An open-ended ureteral catheter was advanced over the wire and up the ureter.  Wire was withdrawn and ICG was instilled into the ureter as the ureteral catheter was withdrawn.  I then readvanced the wire after 10 cc of ICG was instilled.  I then advanced the open-ended ureteral catheter over the wire to the proximal ureter and withdrew the wire.  I disassembled the scope and withdrew it keep in the catheter in place.  I reintroduced the scope into the bladder and performed the same procedure on the right.  I withdrew the scope and placed a Foley catheter.  Ureteral catheters were threaded into drainage ports and attached to the catheter.   There were also secured to the catheter.  This concluded my portion of the operation.  Patient tolerated the procedure well was stable postoperatively.  Plan: As per general surgery

## 2023-03-13 NOTE — Transfer of Care (Signed)
Immediate Anesthesia Transfer of Care Note  Patient: Dylan Mccall  Procedure(s) Performed: LAPAROSCOPIC SIGMOID COLON RESECTION WITH ANASTOMOSIS CYSTOSCOPY with FIREFLY INJECTION  Patient Location: PACU  Anesthesia Type:General  Level of Consciousness: drowsy and patient cooperative  Airway & Oxygen Therapy: Patient Spontanous Breathing and Patient connected to face mask oxygen  Post-op Assessment: Report given to RN and Post -op Vital signs reviewed and stable  Post vital signs: Reviewed and stable  Last Vitals:  Vitals Value Taken Time  BP 129/93 03/13/23 1549  Temp    Pulse 90 03/13/23 1552  Resp 11 03/13/23 1552  SpO2 100 % 03/13/23 1552  Vitals shown include unvalidated device data.  Last Pain:  Vitals:   03/13/23 1041  TempSrc: Oral         Complications: No notable events documented.

## 2023-03-13 NOTE — H&P (Signed)
H&P Physician requesting consult: Feliciana Rossetti  Chief Complaint: Diverticulitis  History of Present Illness: Patient with history of diverticulitis presents for laparoscopic partial colectomy with reanastomosis.  Intraoperative instillation of ICG and ureteral stents requested.  Past Medical History:  Diagnosis Date   Diverticulitis    GERD (gastroesophageal reflux disease)    Hypothyroidism    Thyroid disease    Past Surgical History:  Procedure Laterality Date   lipoma removed left shoulder      right ankle surgery      WISDOM TOOTH EXTRACTION      Home Medications:  Medications Prior to Admission  Medication Sig Dispense Refill Last Dose   cetirizine (ZYRTEC) 10 MG tablet Take 10 mg by mouth daily as needed (for allergies).   Past Week   Cholecalciferol (VITAMIN D) 50 MCG (2000 UT) CAPS Take 2,000 Units by mouth daily.   03/10/2023   fluticasone (FLONASE) 50 MCG/ACT nasal spray Place 2 sprays into both nostrils daily. (Patient taking differently: Place 2 sprays into both nostrils daily as needed for allergies.) 16 g 0    Iron-Vitamin C (VITRON-C PO) Take 1 tablet by mouth daily.   03/10/2023   lansoprazole (PREVACID) 30 MG capsule Take 30 mg by mouth daily.   03/13/2023 at 0815   linaclotide (LINZESS) 72 MCG capsule Take 72 mcg by mouth daily before breakfast.   03/10/2023   liothyronine (CYTOMEL) 5 MCG tablet Take 10 mcg by mouth daily. (Patient not taking: Reported on 03/06/2023)      MAGNESIUM GLYCINATE PO Take 240 mg by mouth daily.   03/10/2023   Multiple Vitamin (MULTIVITAMIN PO) Take 1 tablet by mouth at bedtime.   03/10/2023   Omega-3 Fatty Acids (FISH OIL PO) Take 2 capsules by mouth daily.   03/10/2023   polyethylene glycol (MIRALAX / GLYCOLAX) 17 g packet Take 17 g by mouth 2 (two) times daily.   03/10/2023   Probiotic Product (ALIGN PO) Take 1 capsule by mouth every evening.   03/10/2023   Psyllium 95 % POWD Take 1 Scoop by mouth 2 (two) times daily.   03/10/2023   Red Yeast Rice  Extract (RED YEAST RICE PO) Take 2 capsules by mouth daily.   03/09/2023   SYNTHROID 175 MCG tablet Take 87.5-175 mcg by mouth See admin instructions. Take 175 mcg on Tuesday, Saturday and Sunday and take  2 03/13/2023 at 0815   acetaminophen (TYLENOL) 500 MG tablet Take 500 mg by mouth every 6 (six) hours as needed for moderate pain.   More than a month   HYDROcodone-acetaminophen (NORCO/VICODIN) 5-325 MG tablet Take 1 tablet by mouth every 6 (six) hours as needed for moderate pain. (Patient not taking: Reported on 03/02/2023) 10 tablet 0 Not Taking   ibuprofen (ADVIL,MOTRIN) 200 MG tablet Take 400 mg by mouth every 6 (six) hours as needed for moderate pain.   More than a month   Allergies:  Allergies  Allergen Reactions   Erythromycin Nausea And Vomiting   Sulfa Antibiotics     Red face, burning    Family History  Problem Relation Age of Onset   Diabetes type II Mother    Heart disease Father    Rheum arthritis Sister    Social History:  reports that he has never smoked. He has never used smokeless tobacco. He reports current alcohol use. He reports that he does not use drugs.  ROS: A complete review of systems was performed.  All systems are negative except for pertinent findings  as noted. ROS   Physical Exam:  Vital signs in last 24 hours: Temp:  [97.4 F (36.3 C)-98.7 F (37.1 C)] 97.4 F (36.3 C) (05/07 1549) Pulse Rate:  [77-91] 77 (05/07 1600) Resp:  [15-16] 15 (05/07 1600) BP: (122-129)/(72-95) 126/95 (05/07 1600) SpO2:  [97 %-100 %] 100 % (05/07 1600) Weight:  [103 kg] 103 kg (05/07 1035) General:  Alert and oriented, No acute distress HEENT: Normocephalic, atraumatic Neck: No JVD or lymphadenopathy Cardiovascular: Regular rate and rhythm Lungs: Regular rate and effort Abdomen: Soft, nontender, nondistended, no abdominal masses Back: No CVA tenderness Extremities: No edema Neurologic: Grossly intact  Laboratory Data:  Results for orders placed or performed during  the hospital encounter of 03/13/23 (from the past 24 hour(s))  ABO/Rh     Status: None   Collection Time: 03/13/23 10:50 AM  Result Value Ref Range   ABO/RH(D)      [facility] NEG Performed at [facility], [address] [place], [place] [address]    No results found for this or any previous visit (from the past 240 hour(s)). Creatinine: No results for input(s): "CREATININE" in the last 168 hours.  Impression/Assessment:  Diverticulitis  Plan:  Proceed with cystoscopy with bilateral ureteral catheterization and instillation of ICG with temporary ureteral catheterization placement.  Risk benefits discussed.  Ray Church, III 03/13/2023, 4:15 PM

## 2023-03-13 NOTE — H&P (Signed)
Chief Complaint: Follow-up ( Diverticulitis,)  History of Present Illness: Dylan Mccall is a 52 y.o. male who is seen today as an office consultation for evaluation of Follow-up ( Diverticulitis,)  He has been having intermittent attacks for the last 3 months. He has completed several courses of antibiotics but symptoms often return in a few weeks. He has tried cutting out different foods and is nervous about eating in general now. He has not had any procedures done for the attacks.  Review of Systems: A complete review of systems was obtained from the patient. I have reviewed this information and discussed as appropriate with the patient. See HPI as well for other ROS.  Review of Systems  Constitutional: Negative.  HENT: Negative.  Eyes: Negative.  Respiratory: Negative.  Cardiovascular: Negative.  Gastrointestinal: Positive for abdominal pain.  Genitourinary: Negative.  Musculoskeletal: Negative.  Skin: Negative.  Neurological: Negative.  Endo/Heme/Allergies: Negative.  Psychiatric/Behavioral: Negative.    Medical History: Past Medical History:  Diagnosis Date  Thyroid disease   There is no problem list on file for this patient.  No past surgical history on file.   Allergies  Allergen Reactions  Erythromycin Nausea And Vomiting and Unknown  Sulfamethoxazole-Trimethoprim Unknown   Current Outpatient Medications on File Prior to Visit  Medication Sig Dispense Refill  lansoprazole (PREVACID) 30 MG DR capsule 1 tablet Orally Once a day  levothyroxine (SYNTHROID) 175 MCG tablet as directed  oxyCODONE (ROXICODONE) 5 MG immediate release tablet Take 1 tablet (5 mg total) by mouth every 4 (four) hours as needed for Pain 5 tablet 0   No current facility-administered medications on file prior to visit.   Family History  Problem Relation Age of Onset  Obesity Mother  High blood pressure (Hypertension) Mother  Diabetes Mother  Obesity Father  Coronary Artery Disease  (Blocked arteries around heart) Father  Diabetes Father  Obesity Sister  Diabetes Sister    Social History   Tobacco Use  Smoking Status Never  Smokeless Tobacco Never    Social History   Socioeconomic History  Marital status: Married  Tobacco Use  Smoking status: Never  Smokeless tobacco: Never  Substance and Sexual Activity  Alcohol use: Yes  Alcohol/week: 3.0 standard drinks of alcohol  Types: 3 Standard drinks or equivalent per week  Drug use: Never   Objective:   Vitals:  02/16/23 1357  BP: 129/86  Pulse: 75  Temp: 36.1 C (97 F)  SpO2: 98%  Weight: (!) 103.4 kg (228 lb)  Height: 177.8 cm (5\' 10" )   Body mass index is 32.71 kg/m.  Physical Exam Constitutional:  Appearance: Normal appearance.  HENT:  Head: Normocephalic and atraumatic.  Pulmonary:  Effort: Pulmonary effort is normal.  Abdominal:  Comments: Nontender, no hernias or scars  Musculoskeletal:  General: Normal range of motion.  Cervical back: Normal range of motion.  Neurological:  General: No focal deficit present.  Mental Status: He is alert and oriented to person, place, and time. Mental status is at baseline.  Psychiatric:  Mood and Affect: Mood normal.  Behavior: Behavior normal.  Thought Content: Thought content normal.     Labs, Imaging and Diagnostic Testing: I reviewed most recent CT scan images showing inflammatory changes at the sigmoid area and diverticula present.  Assessment and Plan:   Diagnoses and all orders for this visit:  Diverticulitis of large intestine without perforation or abscess without bleeding   We discussed diverticulitis, treatment options, and possible events with diverticulitis. Specifically, he could continue to have  attacks and eventually this could lead to a stricture. Because he has had repeated infections this year, I think it is reasonable to proceed with surgery for lap sigmoidectomy with anastomosis. We discussed benefits of elective  surgery vs emergent surgery, mainly staged procedures. He is leaning towards surgery but still unsure since he is feeling better now than he has in the past 2 months. He has a colonoscopy from 1 year ago so he is up to date there.  The anatomy & physiology of the digestive tract was discussed. The pathophysiology of the colon was discussed. Natural history risks without surgery was discussed. I feel the risks of no intervention will lead to serious problems that outweigh the operative risks; therefore, I recommended a partial colectomy to remove the pathology. Minimally invasive (Robotic/Laparoscopic) & open techniques were discussed.   Risks such as bleeding, infection, abscess, leak, reoperation, injury to other organs, need for repair of tissues / organs, possible ostomy, hernia, heart attack, stroke, death, and other risks were discussed. I noted a good likelihood this will help address the problem. Goals of post-operative recovery were discussed as well. Need for adequate nutrition, daily bowel regimen and healthy physical activity, to optimize recovery was noted as well. We will work to minimize complications. Educational materials were available as well. Questions were answered. The patient expresses understanding & wishes to proceed with surgery.

## 2023-03-14 ENCOUNTER — Encounter (HOSPITAL_COMMUNITY): Payer: Self-pay | Admitting: General Surgery

## 2023-03-14 LAB — BASIC METABOLIC PANEL
Anion gap: 4 — ABNORMAL LOW (ref 5–15)
BUN: 8 mg/dL (ref 6–20)
CO2: 25 mmol/L (ref 22–32)
Calcium: 8.4 mg/dL — ABNORMAL LOW (ref 8.9–10.3)
Chloride: 107 mmol/L (ref 98–111)
Creatinine, Ser: 0.81 mg/dL (ref 0.61–1.24)
GFR, Estimated: >60 mL/min (ref >60)
Glucose, Bld: 116 mg/dL — ABNORMAL HIGH (ref 70–99)
Potassium: 4.1 mmol/L (ref 3.5–5.1)
Sodium: 136 mmol/L (ref 135–145)

## 2023-03-14 LAB — CBC
HCT: 42.3 % (ref 39.0–52.0)
Hemoglobin: 13.8 g/dL (ref 13.0–17.0)
MCH: 29.4 pg (ref 26.0–34.0)
MCHC: 32.6 g/dL (ref 30.0–36.0)
MCV: 90 fL (ref 80.0–100.0)
Platelets: 296 10*3/uL (ref 150–400)
RBC: 4.7 MIL/uL (ref 4.22–5.81)
RDW: 13.3 % (ref 11.5–15.5)
WBC: 12.5 10*3/uL — ABNORMAL HIGH (ref 4.0–10.5)
nRBC: 0 % (ref 0.0–0.2)

## 2023-03-14 NOTE — Progress Notes (Signed)
  1 Day Post-Op   Chief Complaint/Subjective: He is doing very well. He has minimal pain this morning. He has been passing gas. He denies nausea.  Objective: Vital signs in last 24 hours: Temp:  [97.3 F (36.3 C)-99.8 F (37.7 C)] 97.9 F (36.6 C) (05/08 0936) Pulse Rate:  [63-94] 63 (05/08 0936) Resp:  [10-18] 18 (05/08 0936) BP: (109-160)/(75-102) 125/80 (05/08 0936) SpO2:  [93 %-100 %] 99 % (05/08 0936) Weight:  [104.4 kg] 104.4 kg (05/08 0509) Last BM Date : 03/12/23 Intake/Output from previous day: 05/07 0701 - 05/08 0700 In: 3372.2 [P.O.:480; I.V.:2792.2; IV Piggyback:100] Out: 1610 [Urine:3625; Blood:50]  PE: Gen: NAD Resp: nonlabored Card: RRR Abd: soft, incisions c/d/i  Lab Results:  Recent Labs    03/13/23 1623 03/14/23 0437  WBC 11.0* 12.5*  HGB 14.8 13.8  HCT 45.2 42.3  PLT 273 296   Recent Labs    03/13/23 1623 03/14/23 0437  NA  --  136  K  --  4.1  CL  --  107  CO2  --  25  GLUCOSE  --  116*  BUN  --  8  CREATININE 0.97 0.81  CALCIUM  --  8.4*   No results for input(s): "LABPROT", "INR" in the last 72 hours.    Component Value Date/Time   NA 136 03/14/2023 0437   K 4.1 03/14/2023 0437   CL 107 03/14/2023 0437   CO2 25 03/14/2023 0437   GLUCOSE 116 (H) 03/14/2023 0437   BUN 8 03/14/2023 0437   CREATININE 0.81 03/14/2023 0437   CALCIUM 8.4 (L) 03/14/2023 0437   PROT 7.4 01/17/2023 2041   ALBUMIN 4.4 01/17/2023 2041   AST 27 01/17/2023 2041   ALT 19 01/17/2023 2041   ALKPHOS 63 01/17/2023 2041   BILITOT 0.6 01/17/2023 2041   GFRNONAA >60 03/14/2023 0437   GFRAA >90 02/13/2015 1719    Assessment/Plan  s/p Procedure(s): LAPAROSCOPIC SIGMOID COLON RESECTION WITH ANASTOMOSIS CYSTOSCOPY with FIREFLY INJECTION 03/13/2023   FEN - full liquids VTE - lovenox ID - periop antibiotics Disposition - inpatient   LOS: 1 day   I reviewed last 24 h vitals and pain scores, last 48 h intake and output, last 24 h labs and trends, and last 24 h  imaging results.  This care required moderate level of medical decision making.   De Blanch [facility] Surgery at [facility] 03/14/2023, 11:16 AM Please see Amion for pager number during day hours 7:00am-4:30pm or 7:00am -11:30am on weekends

## 2023-03-14 NOTE — TOC CM/SW Note (Signed)
  Transition of Care [facility]) Screening Note   Patient Details  Name: Maddock Brosnahan Date of Birth: 11/14/1970   Transition of Care [facility]) CM/SW Contact:    Amada Jupiter, LCSW Phone Number: 03/14/2023, 1:14 PM    Transition of Care Department [facility]) has reviewed patient and no TOC needs have been identified at this time. We will continue to monitor patient advancement through interdisciplinary progression rounds. If new patient transition needs arise, please place a TOC consult.

## 2023-03-15 LAB — SURGICAL PATHOLOGY

## 2023-03-15 MED ORDER — POLYETHYLENE GLYCOL 3350 17 G PO PACK
17.0000 g | PACK | Freq: Two times a day (BID) | ORAL | Status: DC
  Administered 2023-03-15 – 2023-03-16 (×3): 17 g via ORAL
  Filled 2023-03-15 (×3): qty 1

## 2023-03-15 NOTE — Progress Notes (Signed)
  2 Days Post-Op   Chief Complaint/Subjective: Pain issues with movement overnight, tolerating liquids, +flatus  Objective: Vital signs in last 24 hours: Temp:  [97.7 F (36.5 C)-98.3 F (36.8 C)] 97.8 F (36.6 C) (05/09 0626) Pulse Rate:  [63-79] 70 (05/09 0626) Resp:  [18] 18 (05/09 0626) BP: (112-125)/(72-91) 122/91 (05/09 0626) SpO2:  [97 %-100 %] 97 % (05/09 0626) Last BM Date : 03/12/23 Intake/Output from previous day: 05/08 0701 - 05/09 0700 In: 1580 [P.O.:1560; I.V.:20] Out: 2900 [Urine:2900]  PE: Gen: NAd Resp: nonlabored Card: RRR Abd: incisions c/d/i  Lab Results:  Recent Labs    03/13/23 1623 03/14/23 0437  WBC 11.0* 12.5*  HGB 14.8 13.8  HCT 45.2 42.3  PLT 273 296   Recent Labs    03/13/23 1623 03/14/23 0437  NA  --  136  K  --  4.1  CL  --  107  CO2  --  25  GLUCOSE  --  116*  BUN  --  8  CREATININE 0.97 0.81  CALCIUM  --  8.4*   No results for input(s): "LABPROT", "INR" in the last 72 hours.    Component Value Date/Time   NA 136 03/14/2023 0437   K 4.1 03/14/2023 0437   CL 107 03/14/2023 0437   CO2 25 03/14/2023 0437   GLUCOSE 116 (H) 03/14/2023 0437   BUN 8 03/14/2023 0437   CREATININE 0.81 03/14/2023 0437   CALCIUM 8.4 (L) 03/14/2023 0437   PROT 7.4 01/17/2023 2041   ALBUMIN 4.4 01/17/2023 2041   AST 27 01/17/2023 2041   ALT 19 01/17/2023 2041   ALKPHOS 63 01/17/2023 2041   BILITOT 0.6 01/17/2023 2041   GFRNONAA >60 03/14/2023 0437   GFRAA >90 02/13/2015 1719    Assessment/Plan  s/p Procedure(s): LAPAROSCOPIC SIGMOID COLON RESECTION WITH ANASTOMOSIS CYSTOSCOPY with FIREFLY INJECTION 03/13/2023    FEN - soft diet VTE - lovenox ID - periop abx Disposition - await first stool, restart miralax, pain control   LOS: 2 days   I reviewed last 24 h vitals and pain scores, last 48 h intake and output, last 24 h labs and trends, and last 24 h imaging results.  This care required moderate level of medical decision making.    De Blanch [facility] Surgery at [facility] 03/15/2023, 8:55 AM Please see Amion for pager number during day hours 7:00am-4:30pm or 7:00am -11:30am on weekends

## 2023-03-16 MED ORDER — IBUPROFEN 800 MG PO TABS: 800.0000 mg | ORAL_TABLET | Freq: Three times a day (TID) | ORAL | 0 refills | Status: AC | PRN

## 2023-03-16 MED ORDER — OXYCODONE HCL 5 MG PO TABS: 5.0000 mg | ORAL_TABLET | Freq: Four times a day (QID) | ORAL | 0 refills | Status: AC | PRN

## 2023-03-16 NOTE — Discharge Summary (Signed)
Physician Discharge Summary  Deantae Riedemann ZOX:[id number] DOB: 12-09-1970 DOA: 03/13/2023  PCP: Jarrett Soho, PA-C  Admit date: 03/13/2023 Discharge date:  03/16/2023   Recommendations for Outpatient Follow-up:   (include homehealth, outpatient follow-up instructions, specific recommendations for PCP to follow-up on, etc.)   Follow-up Information     Trenten Watchman, De Blanch, MD Follow up on 05/01/2023.   Specialty: General Surgery Contact information: [address] [place] [address] [phone]                Discharge Diagnoses:  Principal Problem:   Diverticulitis large intestine   Surgical Procedure: lap sigmoid resection with anastomossi  Discharge Condition: Good Disposition: Home  Diet recommendation: soft   Hospital Course:  52 yo male underwent lap sigmoid resection for recurrent diverticulitis. He was slowly advanced and had return of bowel function POD 2. He was discharged home POD 3.  Discharge Instructions   Allergies as of 03/16/2023       Reactions   Erythromycin Nausea And Vomiting   Sulfa Antibiotics    Red face, burning        Medication List     STOP taking these medications    HYDROcodone-acetaminophen 5-325 MG tablet Commonly known as: NORCO/VICODIN   linaclotide 72 MCG capsule Commonly known as: LINZESS       TAKE these medications    acetaminophen 500 MG tablet Commonly known as: TYLENOL Take 500 mg by mouth every 6 (six) hours as needed for moderate pain.   ALIGN PO Take 1 capsule by mouth every evening.   cetirizine 10 MG tablet Commonly known as: ZYRTEC Take 10 mg by mouth daily as needed (for allergies).   FISH OIL PO Take 2 capsules by mouth daily.   fluticasone 50 MCG/ACT nasal spray Commonly known as: FLONASE Place 2 sprays into both nostrils daily. What changed:  when to take this reasons to take this   ibuprofen 800 MG tablet Commonly known as: ADVIL Take 1 tablet (800 mg  total) by mouth every 8 (eight) hours as needed. What changed:  medication strength how much to take when to take this reasons to take this   lansoprazole 30 MG capsule Commonly known as: PREVACID Take 30 mg by mouth daily.   liothyronine 5 MCG tablet Commonly known as: CYTOMEL Take 10 mcg by mouth daily.   MAGNESIUM GLYCINATE PO Take 240 mg by mouth daily.   MULTIVITAMIN PO Take 1 tablet by mouth at bedtime.   oxyCODONE 5 MG immediate release tablet Commonly known as: Oxy IR/ROXICODONE Take 1 tablet (5 mg total) by mouth every 6 (six) hours as needed for moderate pain.   polyethylene glycol 17 g packet Commonly known as: MIRALAX / GLYCOLAX Take 17 g by mouth 2 (two) times daily.   Psyllium 95 % Powd Take 1 Scoop by mouth 2 (two) times daily.   RED YEAST RICE PO Take 2 capsules by mouth daily.   Synthroid 175 MCG tablet Generic drug: levothyroxine Take 87.5-175 mcg by mouth See admin instructions. Take 175 mcg on Tuesday, Saturday and Sunday and take   Vitamin D 50 MCG (2000 UT) Caps Take 2,000 Units by mouth daily.   VITRON-C PO Take 1 tablet by mouth daily.        Follow-up Information     Jaymen Fetch, De Blanch, MD Follow up on 05/01/2023.   Specialty: General Surgery Contact information: [address] [place] [address] [phone]  The results of significant diagnostics from this hospitalization (including imaging, microbiology, ancillary and laboratory) are listed below for reference.    Significant Diagnostic Studies: No results found.  Labs: Basic Metabolic Panel: Recent Labs  Lab 03/13/23 1623 03/14/23 0437  NA  --  136  K  --  4.1  CL  --  107  CO2  --  25  GLUCOSE  --  116*  BUN  --  8  CREATININE 0.97 0.81  CALCIUM  --  8.4*   Liver Function Tests: No results for input(s): "AST", "ALT", "ALKPHOS", "BILITOT", "PROT", "ALBUMIN" in the last 168 hours.  CBC: Recent Labs  Lab  03/13/23 1623 03/14/23 0437  WBC 11.0* 12.5*  HGB 14.8 13.8  HCT 45.2 42.3  MCV 90.6 90.0  PLT 273 296    CBG: No results for input(s): "GLUCAP" in the last 168 hours.  Principal Problem:   Diverticulitis large intestine   Time coordinating discharge: 15 min

## 2023-03-16 NOTE — Progress Notes (Signed)
Provided discharge education/instructions, all questions and concerns addressed. Pt not in acute distress, discharged home with belongings. 

## 2023-03-16 NOTE — Plan of Care (Signed)
  Problem: Education: Goal: Understanding of discharge needs will improve Outcome: Progressing   Problem: Activity: Goal: Ability to tolerate increased activity will improve Outcome: Progressing   Problem: Bowel/Gastric: Goal: Gastrointestinal status for postoperative course will improve Outcome: Progressing   Problem: Clinical Measurements: Goal: Postoperative complications will be avoided or minimized Outcome: Progressing   Problem: Respiratory: Goal: Respiratory status will improve Outcome: Progressing   Problem: Skin Integrity: Goal: Will show signs of wound healing Outcome: Progressing

## 2023-05-04 ENCOUNTER — Ambulatory Visit (HOSPITAL_BASED_OUTPATIENT_CLINIC_OR_DEPARTMENT_OTHER)
Admission: RE | Admit: 2023-05-04 | Discharge: 2023-05-04 | Disposition: A | Discharge status: Home / Self Care | Payer: BC Managed Care – PPO | Source: Ambulatory Visit | Attending: Gastroenterology | Admitting: Gastroenterology

## 2023-05-04 DIAGNOSIS — R1032 Left lower quadrant pain: Secondary | ICD-10-CM | POA: Diagnosis present

## 2023-05-04 DIAGNOSIS — Z8719 Personal history of other diseases of the digestive system: Secondary | ICD-10-CM | POA: Diagnosis present

## 2023-05-04 MED ORDER — IOHEXOL 300 MG/ML  SOLN
100.0000 mL | Freq: Once | INTRAMUSCULAR | Status: AC | PRN
  Administered 2023-05-04: 100 mL via INTRAVENOUS

## 2023-09-11 DIAGNOSIS — F4323 Adjustment disorder with mixed anxiety and depressed mood: Secondary | ICD-10-CM | POA: Diagnosis not present

## 2023-09-18 DIAGNOSIS — F4323 Adjustment disorder with mixed anxiety and depressed mood: Secondary | ICD-10-CM | POA: Diagnosis not present

## 2023-09-27 DIAGNOSIS — E611 Iron deficiency: Secondary | ICD-10-CM | POA: Diagnosis not present

## 2023-09-27 DIAGNOSIS — Z6831 Body mass index (BMI) 31.0-31.9, adult: Secondary | ICD-10-CM | POA: Diagnosis not present

## 2023-09-28 DIAGNOSIS — F4323 Adjustment disorder with mixed anxiety and depressed mood: Secondary | ICD-10-CM | POA: Diagnosis not present

## 2023-10-02 DIAGNOSIS — F4323 Adjustment disorder with mixed anxiety and depressed mood: Secondary | ICD-10-CM | POA: Diagnosis not present

## 2023-10-07 DIAGNOSIS — H6992 Unspecified Eustachian tube disorder, left ear: Secondary | ICD-10-CM | POA: Diagnosis not present

## 2023-10-09 DIAGNOSIS — F4323 Adjustment disorder with mixed anxiety and depressed mood: Secondary | ICD-10-CM | POA: Diagnosis not present

## 2023-10-23 DIAGNOSIS — F4323 Adjustment disorder with mixed anxiety and depressed mood: Secondary | ICD-10-CM | POA: Diagnosis not present

## 2023-11-02 DIAGNOSIS — J0110 Acute frontal sinusitis, unspecified: Secondary | ICD-10-CM | POA: Diagnosis not present

## 2023-11-02 DIAGNOSIS — Z6831 Body mass index (BMI) 31.0-31.9, adult: Secondary | ICD-10-CM | POA: Diagnosis not present

## 2023-11-02 DIAGNOSIS — H10023 Other mucopurulent conjunctivitis, bilateral: Secondary | ICD-10-CM | POA: Diagnosis not present

## 2023-11-02 DIAGNOSIS — H66001 Acute suppurative otitis media without spontaneous rupture of ear drum, right ear: Secondary | ICD-10-CM | POA: Diagnosis not present

## 2023-11-05 DIAGNOSIS — F4323 Adjustment disorder with mixed anxiety and depressed mood: Secondary | ICD-10-CM | POA: Diagnosis not present

## 2023-11-12 DIAGNOSIS — H938X1 Other specified disorders of right ear: Secondary | ICD-10-CM | POA: Diagnosis not present

## 2023-11-12 DIAGNOSIS — Z6831 Body mass index (BMI) 31.0-31.9, adult: Secondary | ICD-10-CM | POA: Diagnosis not present

## 2023-11-13 DIAGNOSIS — F4323 Adjustment disorder with mixed anxiety and depressed mood: Secondary | ICD-10-CM | POA: Diagnosis not present

## 2023-11-14 DIAGNOSIS — H66001 Acute suppurative otitis media without spontaneous rupture of ear drum, right ear: Secondary | ICD-10-CM | POA: Diagnosis not present

## 2023-11-14 DIAGNOSIS — Z03818 Encounter for observation for suspected exposure to other biological agents ruled out: Secondary | ICD-10-CM | POA: Diagnosis not present

## 2023-11-14 DIAGNOSIS — R0981 Nasal congestion: Secondary | ICD-10-CM | POA: Diagnosis not present

## 2023-11-14 DIAGNOSIS — J029 Acute pharyngitis, unspecified: Secondary | ICD-10-CM | POA: Diagnosis not present

## 2023-11-15 DIAGNOSIS — E611 Iron deficiency: Secondary | ICD-10-CM | POA: Diagnosis not present

## 2023-11-15 DIAGNOSIS — Z6832 Body mass index (BMI) 32.0-32.9, adult: Secondary | ICD-10-CM | POA: Diagnosis not present

## 2023-11-20 DIAGNOSIS — F4323 Adjustment disorder with mixed anxiety and depressed mood: Secondary | ICD-10-CM | POA: Diagnosis not present

## 2023-11-27 DIAGNOSIS — F4323 Adjustment disorder with mixed anxiety and depressed mood: Secondary | ICD-10-CM | POA: Diagnosis not present

## 2023-12-04 ENCOUNTER — Other Ambulatory Visit: Payer: Self-pay | Admitting: Gastroenterology

## 2023-12-04 DIAGNOSIS — K5732 Diverticulitis of large intestine without perforation or abscess without bleeding: Secondary | ICD-10-CM

## 2023-12-04 DIAGNOSIS — F4323 Adjustment disorder with mixed anxiety and depressed mood: Secondary | ICD-10-CM | POA: Diagnosis not present

## 2023-12-04 DIAGNOSIS — R1032 Left lower quadrant pain: Secondary | ICD-10-CM

## 2023-12-04 DIAGNOSIS — Z8719 Personal history of other diseases of the digestive system: Secondary | ICD-10-CM

## 2023-12-06 DIAGNOSIS — E611 Iron deficiency: Secondary | ICD-10-CM | POA: Diagnosis not present

## 2023-12-06 DIAGNOSIS — Z6831 Body mass index (BMI) 31.0-31.9, adult: Secondary | ICD-10-CM | POA: Diagnosis not present

## 2023-12-11 DIAGNOSIS — F4323 Adjustment disorder with mixed anxiety and depressed mood: Secondary | ICD-10-CM | POA: Diagnosis not present

## 2023-12-25 DIAGNOSIS — F4323 Adjustment disorder with mixed anxiety and depressed mood: Secondary | ICD-10-CM | POA: Diagnosis not present

## 2023-12-27 DIAGNOSIS — E611 Iron deficiency: Secondary | ICD-10-CM | POA: Diagnosis not present

## 2023-12-27 DIAGNOSIS — Z6830 Body mass index (BMI) 30.0-30.9, adult: Secondary | ICD-10-CM | POA: Diagnosis not present

## 2024-01-09 DIAGNOSIS — E611 Iron deficiency: Secondary | ICD-10-CM | POA: Diagnosis not present

## 2024-01-09 DIAGNOSIS — Z6830 Body mass index (BMI) 30.0-30.9, adult: Secondary | ICD-10-CM | POA: Diagnosis not present

## 2024-01-09 DIAGNOSIS — E039 Hypothyroidism, unspecified: Secondary | ICD-10-CM | POA: Diagnosis not present

## 2024-01-09 DIAGNOSIS — Z789 Other specified health status: Secondary | ICD-10-CM | POA: Diagnosis not present

## 2024-01-09 DIAGNOSIS — E786 Lipoprotein deficiency: Secondary | ICD-10-CM | POA: Diagnosis not present

## 2024-01-09 DIAGNOSIS — R748 Abnormal levels of other serum enzymes: Secondary | ICD-10-CM | POA: Diagnosis not present

## 2024-01-09 DIAGNOSIS — E291 Testicular hypofunction: Secondary | ICD-10-CM | POA: Diagnosis not present

## 2024-01-10 DIAGNOSIS — F4323 Adjustment disorder with mixed anxiety and depressed mood: Secondary | ICD-10-CM | POA: Diagnosis not present

## 2024-01-15 DIAGNOSIS — F4323 Adjustment disorder with mixed anxiety and depressed mood: Secondary | ICD-10-CM | POA: Diagnosis not present

## 2024-01-17 DIAGNOSIS — Z789 Other specified health status: Secondary | ICD-10-CM | POA: Diagnosis not present

## 2024-01-17 DIAGNOSIS — Z6830 Body mass index (BMI) 30.0-30.9, adult: Secondary | ICD-10-CM | POA: Diagnosis not present

## 2024-01-22 DIAGNOSIS — F4323 Adjustment disorder with mixed anxiety and depressed mood: Secondary | ICD-10-CM | POA: Diagnosis not present

## 2024-01-29 DIAGNOSIS — F4323 Adjustment disorder with mixed anxiety and depressed mood: Secondary | ICD-10-CM | POA: Diagnosis not present

## 2024-02-05 DIAGNOSIS — F4323 Adjustment disorder with mixed anxiety and depressed mood: Secondary | ICD-10-CM | POA: Diagnosis not present

## 2024-02-19 DIAGNOSIS — F4323 Adjustment disorder with mixed anxiety and depressed mood: Secondary | ICD-10-CM | POA: Diagnosis not present

## 2024-02-26 DIAGNOSIS — F4323 Adjustment disorder with mixed anxiety and depressed mood: Secondary | ICD-10-CM | POA: Diagnosis not present

## 2024-02-28 DIAGNOSIS — E039 Hypothyroidism, unspecified: Secondary | ICD-10-CM | POA: Diagnosis not present

## 2024-02-28 DIAGNOSIS — Z6830 Body mass index (BMI) 30.0-30.9, adult: Secondary | ICD-10-CM | POA: Diagnosis not present

## 2024-02-28 DIAGNOSIS — E291 Testicular hypofunction: Secondary | ICD-10-CM | POA: Diagnosis not present

## 2024-02-28 DIAGNOSIS — E611 Iron deficiency: Secondary | ICD-10-CM | POA: Diagnosis not present

## 2024-03-04 DIAGNOSIS — F4323 Adjustment disorder with mixed anxiety and depressed mood: Secondary | ICD-10-CM | POA: Diagnosis not present

## 2024-03-11 DIAGNOSIS — F4323 Adjustment disorder with mixed anxiety and depressed mood: Secondary | ICD-10-CM | POA: Diagnosis not present

## 2024-03-13 DIAGNOSIS — R768 Other specified abnormal immunological findings in serum: Secondary | ICD-10-CM | POA: Diagnosis not present

## 2024-03-13 DIAGNOSIS — Z6831 Body mass index (BMI) 31.0-31.9, adult: Secondary | ICD-10-CM | POA: Diagnosis not present

## 2024-03-18 DIAGNOSIS — F4323 Adjustment disorder with mixed anxiety and depressed mood: Secondary | ICD-10-CM | POA: Diagnosis not present

## 2024-03-25 DIAGNOSIS — F4323 Adjustment disorder with mixed anxiety and depressed mood: Secondary | ICD-10-CM | POA: Diagnosis not present

## 2024-03-27 DIAGNOSIS — R768 Other specified abnormal immunological findings in serum: Secondary | ICD-10-CM | POA: Diagnosis not present

## 2024-03-27 DIAGNOSIS — Z6830 Body mass index (BMI) 30.0-30.9, adult: Secondary | ICD-10-CM | POA: Diagnosis not present

## 2024-04-01 DIAGNOSIS — F4323 Adjustment disorder with mixed anxiety and depressed mood: Secondary | ICD-10-CM | POA: Diagnosis not present

## 2024-04-08 DIAGNOSIS — F4323 Adjustment disorder with mixed anxiety and depressed mood: Secondary | ICD-10-CM | POA: Diagnosis not present

## 2024-04-10 DIAGNOSIS — Z6830 Body mass index (BMI) 30.0-30.9, adult: Secondary | ICD-10-CM | POA: Diagnosis not present

## 2024-04-10 DIAGNOSIS — R635 Abnormal weight gain: Secondary | ICD-10-CM | POA: Diagnosis not present

## 2024-04-10 DIAGNOSIS — R768 Other specified abnormal immunological findings in serum: Secondary | ICD-10-CM | POA: Diagnosis not present

## 2024-04-15 DIAGNOSIS — F4323 Adjustment disorder with mixed anxiety and depressed mood: Secondary | ICD-10-CM | POA: Diagnosis not present

## 2024-04-22 DIAGNOSIS — F4323 Adjustment disorder with mixed anxiety and depressed mood: Secondary | ICD-10-CM | POA: Diagnosis not present

## 2024-04-29 DIAGNOSIS — F4323 Adjustment disorder with mixed anxiety and depressed mood: Secondary | ICD-10-CM | POA: Diagnosis not present

## 2024-05-13 DIAGNOSIS — F4323 Adjustment disorder with mixed anxiety and depressed mood: Secondary | ICD-10-CM | POA: Diagnosis not present

## 2024-05-20 DIAGNOSIS — F4323 Adjustment disorder with mixed anxiety and depressed mood: Secondary | ICD-10-CM | POA: Diagnosis not present

## 2024-05-27 DIAGNOSIS — F4323 Adjustment disorder with mixed anxiety and depressed mood: Secondary | ICD-10-CM | POA: Diagnosis not present

## 2024-06-03 DIAGNOSIS — F4323 Adjustment disorder with mixed anxiety and depressed mood: Secondary | ICD-10-CM | POA: Diagnosis not present

## 2024-06-10 DIAGNOSIS — F4323 Adjustment disorder with mixed anxiety and depressed mood: Secondary | ICD-10-CM | POA: Diagnosis not present

## 2024-06-11 DIAGNOSIS — R1013 Epigastric pain: Secondary | ICD-10-CM | POA: Diagnosis not present

## 2024-06-11 DIAGNOSIS — K219 Gastro-esophageal reflux disease without esophagitis: Secondary | ICD-10-CM | POA: Diagnosis not present

## 2024-06-17 DIAGNOSIS — F4323 Adjustment disorder with mixed anxiety and depressed mood: Secondary | ICD-10-CM | POA: Diagnosis not present

## 2024-06-18 DIAGNOSIS — R1012 Left upper quadrant pain: Secondary | ICD-10-CM | POA: Diagnosis not present

## 2024-06-18 DIAGNOSIS — K31A0 Gastric intestinal metaplasia, unspecified: Secondary | ICD-10-CM | POA: Diagnosis not present

## 2024-06-18 DIAGNOSIS — K219 Gastro-esophageal reflux disease without esophagitis: Secondary | ICD-10-CM | POA: Diagnosis not present

## 2024-06-24 DIAGNOSIS — F4323 Adjustment disorder with mixed anxiety and depressed mood: Secondary | ICD-10-CM | POA: Diagnosis not present

## 2024-06-27 DIAGNOSIS — K317 Polyp of stomach and duodenum: Secondary | ICD-10-CM | POA: Diagnosis not present

## 2024-06-27 DIAGNOSIS — K31A19 Gastric intestinal metaplasia without dysplasia, unspecified site: Secondary | ICD-10-CM | POA: Diagnosis not present

## 2024-06-27 DIAGNOSIS — K219 Gastro-esophageal reflux disease without esophagitis: Secondary | ICD-10-CM | POA: Diagnosis not present

## 2024-06-27 DIAGNOSIS — D3A092 Benign carcinoid tumor of the stomach: Secondary | ICD-10-CM | POA: Diagnosis not present

## 2024-06-27 DIAGNOSIS — K2940 Chronic atrophic gastritis without bleeding: Secondary | ICD-10-CM | POA: Diagnosis not present

## 2024-06-27 DIAGNOSIS — K31A0 Gastric intestinal metaplasia, unspecified: Secondary | ICD-10-CM | POA: Diagnosis not present

## 2024-06-27 DIAGNOSIS — K2930 Chronic superficial gastritis without bleeding: Secondary | ICD-10-CM | POA: Diagnosis not present

## 2024-06-27 DIAGNOSIS — K3189 Other diseases of stomach and duodenum: Secondary | ICD-10-CM | POA: Diagnosis not present

## 2024-06-27 DIAGNOSIS — R1012 Left upper quadrant pain: Secondary | ICD-10-CM | POA: Diagnosis not present

## 2024-07-08 DIAGNOSIS — F4323 Adjustment disorder with mixed anxiety and depressed mood: Secondary | ICD-10-CM | POA: Diagnosis not present

## 2024-07-15 DIAGNOSIS — F4323 Adjustment disorder with mixed anxiety and depressed mood: Secondary | ICD-10-CM | POA: Diagnosis not present

## 2024-07-22 DIAGNOSIS — F4323 Adjustment disorder with mixed anxiety and depressed mood: Secondary | ICD-10-CM | POA: Diagnosis not present

## 2024-07-23 DIAGNOSIS — Z6832 Body mass index (BMI) 32.0-32.9, adult: Secondary | ICD-10-CM | POA: Diagnosis not present

## 2024-07-23 DIAGNOSIS — E611 Iron deficiency: Secondary | ICD-10-CM | POA: Diagnosis not present

## 2024-07-23 DIAGNOSIS — E291 Testicular hypofunction: Secondary | ICD-10-CM | POA: Diagnosis not present

## 2024-07-29 DIAGNOSIS — F4323 Adjustment disorder with mixed anxiety and depressed mood: Secondary | ICD-10-CM | POA: Diagnosis not present

## 2024-07-30 DIAGNOSIS — R768 Other specified abnormal immunological findings in serum: Secondary | ICD-10-CM | POA: Diagnosis not present

## 2024-07-30 DIAGNOSIS — E611 Iron deficiency: Secondary | ICD-10-CM | POA: Diagnosis not present

## 2024-07-30 DIAGNOSIS — Z6832 Body mass index (BMI) 32.0-32.9, adult: Secondary | ICD-10-CM | POA: Diagnosis not present

## 2024-07-30 DIAGNOSIS — R972 Elevated prostate specific antigen [PSA]: Secondary | ICD-10-CM | POA: Diagnosis not present

## 2024-07-30 DIAGNOSIS — E291 Testicular hypofunction: Secondary | ICD-10-CM | POA: Diagnosis not present

## 2024-07-30 DIAGNOSIS — E039 Hypothyroidism, unspecified: Secondary | ICD-10-CM | POA: Diagnosis not present

## 2024-08-05 DIAGNOSIS — F4323 Adjustment disorder with mixed anxiety and depressed mood: Secondary | ICD-10-CM | POA: Diagnosis not present

## 2024-08-08 DIAGNOSIS — K31A0 Gastric intestinal metaplasia, unspecified: Secondary | ICD-10-CM | POA: Diagnosis not present

## 2024-08-08 DIAGNOSIS — K2940 Chronic atrophic gastritis without bleeding: Secondary | ICD-10-CM | POA: Diagnosis not present

## 2024-08-08 DIAGNOSIS — K3189 Other diseases of stomach and duodenum: Secondary | ICD-10-CM | POA: Diagnosis not present

## 2024-08-12 DIAGNOSIS — F4323 Adjustment disorder with mixed anxiety and depressed mood: Secondary | ICD-10-CM | POA: Diagnosis not present

## 2024-08-14 DIAGNOSIS — R748 Abnormal levels of other serum enzymes: Secondary | ICD-10-CM | POA: Diagnosis not present

## 2024-08-14 DIAGNOSIS — Z6832 Body mass index (BMI) 32.0-32.9, adult: Secondary | ICD-10-CM | POA: Diagnosis not present

## 2024-08-19 DIAGNOSIS — F4323 Adjustment disorder with mixed anxiety and depressed mood: Secondary | ICD-10-CM | POA: Diagnosis not present

## 2024-08-26 DIAGNOSIS — F4323 Adjustment disorder with mixed anxiety and depressed mood: Secondary | ICD-10-CM | POA: Diagnosis not present

## 2024-09-04 DIAGNOSIS — R7689 Other specified abnormal immunological findings in serum: Secondary | ICD-10-CM | POA: Diagnosis not present

## 2024-09-04 DIAGNOSIS — Z6832 Body mass index (BMI) 32.0-32.9, adult: Secondary | ICD-10-CM | POA: Diagnosis not present

## 2024-09-09 DIAGNOSIS — F4323 Adjustment disorder with mixed anxiety and depressed mood: Secondary | ICD-10-CM | POA: Diagnosis not present

## 2024-09-18 DIAGNOSIS — R635 Abnormal weight gain: Secondary | ICD-10-CM | POA: Diagnosis not present

## 2024-09-18 DIAGNOSIS — E039 Hypothyroidism, unspecified: Secondary | ICD-10-CM | POA: Diagnosis not present

## 2024-09-18 DIAGNOSIS — Z1339 Encounter for screening examination for other mental health and behavioral disorders: Secondary | ICD-10-CM | POA: Diagnosis not present

## 2024-09-18 DIAGNOSIS — E669 Obesity, unspecified: Secondary | ICD-10-CM | POA: Diagnosis not present

## 2024-09-18 DIAGNOSIS — E291 Testicular hypofunction: Secondary | ICD-10-CM | POA: Diagnosis not present

## 2024-09-18 DIAGNOSIS — Z1331 Encounter for screening for depression: Secondary | ICD-10-CM | POA: Diagnosis not present

## 2024-09-25 DIAGNOSIS — F4323 Adjustment disorder with mixed anxiety and depressed mood: Secondary | ICD-10-CM | POA: Diagnosis not present

## 2024-10-07 DIAGNOSIS — F4323 Adjustment disorder with mixed anxiety and depressed mood: Secondary | ICD-10-CM | POA: Diagnosis not present

## 2024-10-09 DIAGNOSIS — E039 Hypothyroidism, unspecified: Secondary | ICD-10-CM | POA: Diagnosis not present

## 2024-10-09 DIAGNOSIS — Z6832 Body mass index (BMI) 32.0-32.9, adult: Secondary | ICD-10-CM | POA: Diagnosis not present

## 2024-10-21 DIAGNOSIS — F4323 Adjustment disorder with mixed anxiety and depressed mood: Secondary | ICD-10-CM | POA: Diagnosis not present

## 2024-10-22 DIAGNOSIS — R059 Cough, unspecified: Secondary | ICD-10-CM | POA: Diagnosis not present

## 2024-10-22 DIAGNOSIS — H66002 Acute suppurative otitis media without spontaneous rupture of ear drum, left ear: Secondary | ICD-10-CM | POA: Diagnosis not present

## 2024-10-22 DIAGNOSIS — R051 Acute cough: Secondary | ICD-10-CM | POA: Diagnosis not present

## 2024-10-22 DIAGNOSIS — Z6833 Body mass index (BMI) 33.0-33.9, adult: Secondary | ICD-10-CM | POA: Diagnosis not present
